# Patient Record
Sex: Female | Born: 1960 | Race: Black or African American | Hispanic: No | Smoking: Never smoker
Health system: Southern US, Community
[De-identification: ages and names within clinical notes are randomized; demographics above are authoritative.]

## PROBLEM LIST (undated history)

## (undated) DIAGNOSIS — B009 Herpesviral infection, unspecified: Secondary | ICD-10-CM

## (undated) DIAGNOSIS — K219 Gastro-esophageal reflux disease without esophagitis: Secondary | ICD-10-CM

## (undated) HISTORY — DX: Herpesviral infection, unspecified: B00.9

## (undated) HISTORY — DX: Gastro-esophageal reflux disease without esophagitis: K21.9

---

## 1998-03-21 ENCOUNTER — Other Ambulatory Visit: Admission: RE | Admit: 1998-03-21 | Discharge: 1998-03-21 | Payer: Self-pay | Admitting: Obstetrics and Gynecology

## 1999-03-12 ENCOUNTER — Other Ambulatory Visit: Admission: RE | Admit: 1999-03-12 | Discharge: 1999-03-12 | Payer: Self-pay | Admitting: Gynecology

## 1999-07-16 ENCOUNTER — Inpatient Hospital Stay (HOSPITAL_COMMUNITY): Admission: AD | Admit: 1999-07-16 | Discharge: 1999-07-16 | Payer: Self-pay | Admitting: Obstetrics and Gynecology

## 1999-10-07 ENCOUNTER — Observation Stay (HOSPITAL_COMMUNITY): Admission: AD | Admit: 1999-10-07 | Discharge: 1999-10-08 | Payer: Self-pay | Admitting: Gynecology

## 1999-10-13 ENCOUNTER — Inpatient Hospital Stay (HOSPITAL_COMMUNITY): Admission: AD | Admit: 1999-10-13 | Discharge: 1999-10-16 | Payer: Self-pay | Admitting: *Deleted

## 1999-11-20 ENCOUNTER — Other Ambulatory Visit: Admission: RE | Admit: 1999-11-20 | Discharge: 1999-11-20 | Payer: Self-pay | Admitting: Gynecology

## 2000-07-25 ENCOUNTER — Emergency Department (HOSPITAL_COMMUNITY): Admission: EM | Admit: 2000-07-25 | Discharge: 2000-07-25 | Payer: Self-pay | Admitting: Emergency Medicine

## 2000-12-31 ENCOUNTER — Other Ambulatory Visit: Admission: RE | Admit: 2000-12-31 | Discharge: 2000-12-31 | Payer: Self-pay | Admitting: Obstetrics

## 2001-01-08 ENCOUNTER — Encounter: Payer: Self-pay | Admitting: Obstetrics

## 2001-01-08 ENCOUNTER — Ambulatory Visit (HOSPITAL_COMMUNITY): Admission: RE | Admit: 2001-01-08 | Discharge: 2001-01-08 | Payer: Self-pay | Admitting: Obstetrics

## 2002-02-09 ENCOUNTER — Other Ambulatory Visit: Admission: RE | Admit: 2002-02-09 | Discharge: 2002-02-09 | Payer: Self-pay | Admitting: *Deleted

## 2002-02-10 ENCOUNTER — Encounter: Payer: Self-pay | Admitting: Gynecology

## 2002-02-10 ENCOUNTER — Ambulatory Visit (HOSPITAL_COMMUNITY): Admission: RE | Admit: 2002-02-10 | Discharge: 2002-02-10 | Payer: Self-pay | Admitting: Gynecology

## 2003-01-09 ENCOUNTER — Emergency Department (HOSPITAL_COMMUNITY): Admission: EM | Admit: 2003-01-09 | Discharge: 2003-01-09 | Payer: Self-pay | Admitting: Emergency Medicine

## 2003-02-16 ENCOUNTER — Ambulatory Visit (HOSPITAL_COMMUNITY): Admission: RE | Admit: 2003-02-16 | Discharge: 2003-02-16 | Payer: Self-pay | Admitting: Gynecology

## 2003-02-16 ENCOUNTER — Encounter: Payer: Self-pay | Admitting: Gynecology

## 2003-02-24 ENCOUNTER — Other Ambulatory Visit: Admission: RE | Admit: 2003-02-24 | Discharge: 2003-02-24 | Payer: Self-pay | Admitting: Gynecology

## 2004-03-05 ENCOUNTER — Ambulatory Visit (HOSPITAL_COMMUNITY): Admission: RE | Admit: 2004-03-05 | Discharge: 2004-03-05 | Payer: Self-pay | Admitting: Gynecology

## 2004-03-13 ENCOUNTER — Other Ambulatory Visit: Admission: RE | Admit: 2004-03-13 | Discharge: 2004-03-13 | Payer: Self-pay | Admitting: Gynecology

## 2005-03-11 ENCOUNTER — Ambulatory Visit (HOSPITAL_COMMUNITY): Admission: RE | Admit: 2005-03-11 | Discharge: 2005-03-11 | Payer: Self-pay | Admitting: Gynecology

## 2005-03-19 ENCOUNTER — Other Ambulatory Visit: Admission: RE | Admit: 2005-03-19 | Discharge: 2005-03-19 | Payer: Self-pay | Admitting: Gynecology

## 2005-07-25 ENCOUNTER — Other Ambulatory Visit: Admission: RE | Admit: 2005-07-25 | Discharge: 2005-07-25 | Payer: Self-pay | Admitting: Gynecology

## 2006-03-12 ENCOUNTER — Ambulatory Visit (HOSPITAL_COMMUNITY): Admission: RE | Admit: 2006-03-12 | Discharge: 2006-03-12 | Payer: Self-pay | Admitting: Gynecology

## 2006-03-27 ENCOUNTER — Other Ambulatory Visit: Admission: RE | Admit: 2006-03-27 | Discharge: 2006-03-27 | Payer: Self-pay | Admitting: Gynecology

## 2006-08-13 ENCOUNTER — Emergency Department (HOSPITAL_COMMUNITY): Admission: EM | Admit: 2006-08-13 | Discharge: 2006-08-13 | Payer: Self-pay | Admitting: Emergency Medicine

## 2007-03-15 ENCOUNTER — Ambulatory Visit (HOSPITAL_COMMUNITY): Admission: RE | Admit: 2007-03-15 | Discharge: 2007-03-15 | Payer: Self-pay | Admitting: Gynecology

## 2007-04-15 ENCOUNTER — Other Ambulatory Visit: Admission: RE | Admit: 2007-04-15 | Discharge: 2007-04-15 | Payer: Self-pay | Admitting: Gynecology

## 2008-03-17 ENCOUNTER — Ambulatory Visit (HOSPITAL_COMMUNITY): Admission: RE | Admit: 2008-03-17 | Discharge: 2008-03-17 | Payer: Self-pay | Admitting: Gynecology

## 2008-04-17 ENCOUNTER — Other Ambulatory Visit: Admission: RE | Admit: 2008-04-17 | Discharge: 2008-04-17 | Payer: Self-pay | Admitting: Gynecology

## 2009-05-22 ENCOUNTER — Encounter: Admission: RE | Admit: 2009-05-22 | Discharge: 2009-05-22 | Payer: Self-pay | Admitting: Gynecology

## 2009-06-07 ENCOUNTER — Encounter: Payer: Self-pay | Admitting: Women's Health

## 2009-06-07 ENCOUNTER — Other Ambulatory Visit: Admission: RE | Admit: 2009-06-07 | Discharge: 2009-06-07 | Payer: Self-pay | Admitting: Gynecology

## 2009-06-07 ENCOUNTER — Ambulatory Visit: Payer: Self-pay | Admitting: Women's Health

## 2009-12-25 HISTORY — PX: KNEE SURGERY: SHX244

## 2010-04-11 ENCOUNTER — Ambulatory Visit: Payer: Self-pay | Admitting: Women's Health

## 2010-05-23 ENCOUNTER — Encounter: Admission: RE | Admit: 2010-05-23 | Discharge: 2010-05-23 | Payer: Self-pay | Admitting: Gynecology

## 2010-06-10 ENCOUNTER — Other Ambulatory Visit: Admission: RE | Admit: 2010-06-10 | Discharge: 2010-06-10 | Payer: Self-pay | Admitting: Gynecology

## 2010-06-10 ENCOUNTER — Ambulatory Visit: Payer: Self-pay | Admitting: Women's Health

## 2010-11-17 ENCOUNTER — Encounter: Payer: Self-pay | Admitting: Gynecology

## 2011-05-14 ENCOUNTER — Other Ambulatory Visit: Payer: Self-pay | Admitting: Women's Health

## 2011-05-14 DIAGNOSIS — Z1231 Encounter for screening mammogram for malignant neoplasm of breast: Secondary | ICD-10-CM

## 2011-05-27 ENCOUNTER — Ambulatory Visit (HOSPITAL_COMMUNITY)
Admission: RE | Admit: 2011-05-27 | Discharge: 2011-05-27 | Disposition: A | Discharge status: Home / Self Care | Payer: BC Managed Care – PPO | Source: Ambulatory Visit | Attending: Women's Health | Admitting: Women's Health

## 2011-05-27 DIAGNOSIS — Z1231 Encounter for screening mammogram for malignant neoplasm of breast: Secondary | ICD-10-CM | POA: Insufficient documentation

## 2011-06-12 ENCOUNTER — Ambulatory Visit (INDEPENDENT_AMBULATORY_CARE_PROVIDER_SITE_OTHER): Payer: BC Managed Care – PPO | Admitting: Women's Health

## 2011-06-12 ENCOUNTER — Encounter: Payer: Self-pay | Admitting: Women's Health

## 2011-06-12 ENCOUNTER — Other Ambulatory Visit (HOSPITAL_COMMUNITY)
Admission: RE | Admit: 2011-06-12 | Discharge: 2011-06-12 | Disposition: A | Discharge status: Home / Self Care | Payer: BC Managed Care – PPO | Source: Ambulatory Visit | Attending: Women's Health | Admitting: Women's Health

## 2011-06-12 VITALS — BP 120/70 | Ht 65.5 in | Wt 150.0 lb

## 2011-06-12 DIAGNOSIS — Z833 Family history of diabetes mellitus: Secondary | ICD-10-CM

## 2011-06-12 DIAGNOSIS — Z01419 Encounter for gynecological examination (general) (routine) without abnormal findings: Secondary | ICD-10-CM

## 2011-06-12 DIAGNOSIS — R82998 Other abnormal findings in urine: Secondary | ICD-10-CM

## 2011-06-12 DIAGNOSIS — Z13220 Encounter for screening for lipoid disorders: Secondary | ICD-10-CM

## 2011-06-12 DIAGNOSIS — R87810 Cervical high risk human papillomavirus (HPV) DNA test positive: Secondary | ICD-10-CM | POA: Insufficient documentation

## 2011-06-12 NOTE — Progress Notes (Signed)
Brenda Hurst 1961/08/02 [id number]    History:    The patient presents for annual exam.  History of HSV with rare outbreaks.   Past medical history, past surgical history, family history and social history were all reviewed and documented in the EPIC chart.   ROS:  A  ROS was performed and pertinent positives and negatives are included in the history.  Exam:  Filed Vitals:   06/12/11 0939  BP: 120/70    General appearance:  Normal Head/Neck:  Normal, without cervical or supraclavicular adenopathy. Thyroid:  Symmetrical, normal in size, without palpable masses or nodularity. Respiratory  Effort:  Normal  Auscultation:  Clear without wheezing or rhonchi Cardiovascular  Auscultation:  Regular rate, without rubs, murmurs or gallops  Edema/varicosities:  Not grossly evident Abdominal   Soft,nontender, without masses, guarding or rebound.  Liver/spleen:  No organomegaly noted  Hernia:  None appreciated  Occult test:   Skin  Inspection:  Grossly normal  Palpation:  Grossly normal Neurologic/psychiatric  Orientation:  Normal with appropriate conversation.  Mood/affect:  Normal  Genitourinary    Breasts: Examined lying and sitting.     Right: Without masses, retractions, discharge or axillary adenopathy.     Left: Without masses, retractions, discharge or axillary adenopathy.   Inguinal/mons:  Normal without inguinal adenopathy  External genitalia:  Normal  BUS/Urethra/Skene's glands:  Normal  Bladder:  Normal  Vagina:  Normal  Cervix:  Normal  Uterus:   normal in size, shape and contour.  Midline and mobile  Adnexa/parametria:     Rt: Without masses or tenderness.   Lt: Without masses or tenderness.  Anus and perineum: Normal  Digital rectal exam: Normal sphincter tone without palpated masses or tenderness  Assessment/Plan:  50 y.o. MBF G1P1 annual exam.   Had a monthly cycle until this year, now is having a cycle every 2-3 months menopause was reviewed. Has not  used any contraception for many years. History of infertility, did review  condoms until no cycle for greater than a year. Having an occasional hot flash, nothing severe. Reviewed if no cycle for greater than 3 months to return to the office to check a FSH. HRT reviewed. Will check a CBC,  lipid profile, glucose, UA and Pap. SBEs, had a normal mammogram July 2012, encouraged to continue exercise, vitamin D 2000 daily andcalcium rich diet encouraged. Colonoscopy reviewed,will schedule.Marland Kitchen    Harrington Challenger [facility], 12:04 PM 06/12/2011

## 2011-06-13 ENCOUNTER — Telehealth: Payer: Self-pay | Admitting: Women's Health

## 2011-06-13 ENCOUNTER — Telehealth: Payer: Self-pay

## 2011-06-13 DIAGNOSIS — N390 Urinary tract infection, site not specified: Secondary | ICD-10-CM

## 2011-06-13 MED ORDER — SULFAMETHOXAZOLE-TRIMETHOPRIM 800-160 MG PO TABS: 1.0000 | ORAL_TABLET | Freq: Two times a day (BID) | ORAL | Status: AC

## 2011-06-13 NOTE — Telephone Encounter (Signed)
Telephone call, left message on cell to call Sherri. Reviewed glucose normal, hemoglobin and hematocrit are normal cholesterol -  lipid profile is great. Urine did show 50,000 pure . If symptomatic of pain burning frequency or urgency please call in Septra DS one by mouth twice a day for 3 days. #6

## 2011-06-13 NOTE — Telephone Encounter (Signed)
Addended by: Venora Maples on: 06/13/2011 09:32 AM   Modules accepted: Orders

## 2011-06-13 NOTE — Telephone Encounter (Signed)
PT. NOTIFIED OF NANCY'S NOTE. PT STATES SHE IS HAVING URINARY FREQUENCY. I  ESCRIBED RX TO PT'S PHARMACY & TOLD HER IF SHE DOES NOT IMPROVE 100% TO FOLLOW UP WITH NANCY NEST WEEK.

## 2011-06-13 NOTE — Telephone Encounter (Signed)
OPENED IN ERROR

## 2011-06-18 ENCOUNTER — Telehealth: Payer: Self-pay | Admitting: Women's Health

## 2011-06-18 NOTE — Telephone Encounter (Signed)
Reviewed Pap results. She's had a history of normal Paps. Pap was ASCUS with positive high-risk HPV. Did review HPV, will schedule a C. and B. with Dr. Lily Peer, switch to scheduling.

## 2011-06-20 ENCOUNTER — Encounter: Payer: Self-pay | Admitting: Gynecology

## 2011-06-20 ENCOUNTER — Ambulatory Visit (INDEPENDENT_AMBULATORY_CARE_PROVIDER_SITE_OTHER): Payer: BC Managed Care – PPO | Admitting: Gynecology

## 2011-06-20 VITALS — BP 116/78

## 2011-06-20 DIAGNOSIS — N912 Amenorrhea, unspecified: Secondary | ICD-10-CM

## 2011-06-20 DIAGNOSIS — R87610 Atypical squamous cells of undetermined significance on cytologic smear of cervix (ASC-US): Secondary | ICD-10-CM

## 2011-06-20 NOTE — Patient Instructions (Signed)
Patient information: Management of atypical squamous cells (ASC-US and ASC-H) and low grade cervical squamous intraepithelial lesions (LSIL) (Beyond the Basics)    Section Editor Alvera Novel, MD Deputy Editor Morton Amy, MD Disclosures  All topics are updated as new evidence becomes available and our peer review process is complete.  Literature review current through: Jul 2012.  This topic last updated: Feb 05, 2011.  INTRODUCTION - Squamous cells make up the outer layer of the cervix and vagina (picture 1). Atypical squamous cells (ASC) is the name given to squamous cells on a Pap smear or cervical cytology that do not have a normal appearance but are not clearly precancerous. Low grade squamous intraepithelial lesions (LSIL, also called low grade cervical intraepithelial neoplasia) refers to cells that appear slightly abnormal. Women who have ASC or LSIL require further testing because some women with these findings have a precancerous lesion of the cervix. This topic review discusses the management of women with ASC and LSIL. The management of women with high grade squamous intraepithelial lesions (HSIL) and atypical glandular cells (AGC) are discussed in a separate topic review. (See "Patient information: Management of high grade cervical squamous intraepithelial lesions (HSIL) and glandular abnormalities (AGC) (Beyond the Basics)".) ATYPICAL SQUAMOUS CELLS (ASC) - ASC is subdivided into atypical squamous cells of undetermined significance (ASC-US) and atypical squamous cells, cannot rule out a high grade lesion (ASC-H). The risk of a high-grade precancerous lesion in women with ASC-US is 15 percent and for those with ASC-H, the risk is 38 percent [1]. Atypical squamous cells of undetermined significance (ASC-US) - In women older than 20 years, there are three options for evaluation of a single ASC-US result. Women who are pregnant or younger than age 43 years are evaluated differently (see  'Adolescents' below and 'Pregnant women' below). Perform HPV testing. This is the preferred follow up for ASC-US. HPV testing is often done at the same time as the Pap smear. This is convenient because the woman does not have to return for a second visit. HPV testing is described in detail in a separate topic review (see "Patient information: Cervical cancer screening (Beyond the Basics)").  Women who test positive for HPV types that are high risk for cervical cancer should have colposcopy because they are at greater risk of having an underlying precancerous lesion.  Women who test negative for HPV are not likely to have cervical precancer. These women should have a repeat Pap smear in one year. In most cases, the ASC-US resolves during this time.  Repeat the Pap smear in six months. If this test is normal, it is repeated once more after another six months until there have been two normal tests in a row; the woman can then return to routine screening. If the woman has a second ASC-US result or a more severe abnormality develops, colposcopy is recommended. (See 'Colposcopy' below.)  Have colposcopy. (See 'Colposcopy' below.) Atypical squamous cells, cannot rule out a high grade lesion (ASC-H) - ASC-H is more likely than ASC-US to be caused by a precancerous change. This finding requires further evaluation with colposcopy (see 'Colposcopy' below). LOW-GRADE SQUAMOUS LESION (LSIL) - LSIL is usually caused by mild cellular changes. Further testing with colposcopy and cervical biopsy is almost always recommended for women with LSIL because 12 to 16 percent of women with LSIL have a precancerous lesion [2,3]. However, adolescents and postmenopausal women are evaluated somewhat differently (see 'Adolescents' below and 'Postmenopausal women' below). Pregnant women are evaluated similarly to non-pregnant women  but are also discussed separately below. The management of women with LSIL depends upon what is seen with  colposcopy and biopsy (see 'Management after colposcopy' below); most clinicians will delay biopsy until after delivery in pregnant women (see 'Pregnant women' below). COLPOSCOPY - Colposcopy is an office procedure that allows a clinician to closely examine the cervix. It is commonly performed after an abnormal Pap smear. Colposcopy is performed similar to a pelvic examination, while the woman lies on an exam table. A speculum is used to view the cervix, and the viewing device (called a colposcope) remains outside the woman's body (picture 1). The colposcope magnifies the appearance of the cervix. This allows the clinician to better see the location and size of any abnormalities, and also to see any changes in the capillaries (small blood vessels) on the surface of the cervix.  During colposcopy, a small piece of the abnormal area can be removed (biopsied). Anesthesia (numbing medicine) is not needed because the biopsy causes only mild discomfort or cramping. Some women also need to have a biopsy of the inner cervix during colposcopy; this is called endocervical curettage (ECC). Endocervix refers to the inner cervix and curettage means scraping. Pregnant women should not have ECC because it may disturb the pregnancy. Management after colposcopy - Most women who have a colposcopy have a biopsy of any abnormal-appearing areas. The biopsy samples are sent to a pathologist who determines if there is any evidence of precancerous changes, termed cervical intraepithelial neoplasia (CIN). These changes are categorized as being mild (CIN 1) or moderate to severe (CIN 2 or 3). CIN 1 biopsy in women with Pap smear results that were ASC-US, ASC-H or LSIL cytology - Follow-up is recommended with either HPV testing at 12 months or a Pap smear at 6 and 12 months. The reason for this recommendation is that CIN I is a minor abnormality that usually goes away over time without treatment. Waiting and repeating testing allows  time for the abnormality to resolve and also enables the healthcare provider to identify the few situations in which the abnormality has become more severe. Repeat colposcopy is recommended if the results of the follow-up Pap smear are ASC or greater or if the HPV test is positive. Women with two consecutive negative repeat cytology results or a negative HPV test can resume routine screening.  CIN biopsy in women with Pap smear results that were high-grade SIL (HSIL) or atypical glandular cells-not otherwise specified - Follow-up can be one of three options: (1) Pap smear and colposcopy every six months for a year; (2) re-review of both Pap smear and biopsy results by a pathologist; or (3) a procedure to remove a larger piece of tissue from the cervix (cone biopsy or loop electrosurgical excision procedure [called LEEP, loop, or LLETZ]).  CIN 2 or 3 - CIN 2 or 3 is usually treated by removing or destroying the abnormal area (using a cone biopsy, LEEP, laser, or freezing procedure). The reason for this recommendation is that moderate to severe precancerous abnormalities (CIN 2 or 3) are unlikely to resolve over time without treatment and may progress to cancer if left untreated over a period of years. (See "Patient information: Treatment of precancerous cells of the cervix (Beyond the Basics)".) However, adolescents and pregnant women are often able to delay treatment (see 'Adolescents' below and 'Pregnant women' below). SPECIAL CIRCUMSTANCES Postmenopausal women - In postmenopausal women, LSIL may be evaluated differently because thinning and drying of the tissues (referred to as atrophy) can  cause the cells to appear abnormal. These changes often resolve with time and are often not related to changes caused by HPV. Options for postmenopausal women with LSIL include the following: Colposcopy  HPV testing  Repeat Pap smear at 6 and 12 months If the HPV testing or repeat Pap smear tests are negative, the woman  may return to routine testing. If the HPV test or repeat Pap smear are abnormal (ASC or greater), colposcopy is recommended. The management of postmenopausal women after colposcopy is discussed above (see 'Management after colposcopy' above). Adolescents - In adolescent women (age 41 years or younger), abnormal Pap smear is often approached differently because, in this age group, there is a good chance that the abnormal area will resolve over time, without treatment. There is a high rate of HPV infection in this group, but a very low rate of cervical cancer. ASC-US, LSIL, and/or CIN 1 - Adolescents with ASC-US, LSIL, and/or CIN 1 are often advised to have repeat Pap smear in 12 months. HPV testing is not recommended because it is likely to be positive and would not affect the recommendation to repeat the test in 12 months. If the 12 month cytology shows ASC-US, ASC-H, or LSIL, the test is usually repeated 12 months later (at 24 months).  If the 12 month cytology shows HSIL or worse, the adolescent is usually advised to have colposcopy (see 'Colposcopy' above). If the 24 month test is abnormal (ASC-US or greater), the adolescent is usually advised to have colposcopy. If the 24 month test is normal, Pap smear is recommended once yearly. High grade lesions (CIN 2 or 3) - Adolescents with HSIL should undergo colposcopy. If cervical biopsy does NOT show HSIL, they can be followed with colposcopy and Pap smear every six months for two years. If cervical biopsy confirms HSIL, they can either be followed with Pap smear and colposcopy or HPV testing until they have had normal testing for one year. If these follow-up results are normal, they can resume routine screening. If follow-up testing shows abnormalities or the cervix cannot be fully evaluated, they will need further testing or removal of a part of the cervix (cone biopsy or LEEP). (See "Patient information: Treatment of precancerous cells of the cervix (Beyond  the Basics)".) Pregnant women - The evaluation and management of pregnant women is different from non-pregnant women because of the risk that trauma to the cervix could lead to preterm labor or delivery. ASC-US - Pregnant women with ASC-US and a positive HPV test may elect to have colposcopy during pregnancy or wait until at least six weeks after delivering their baby. The reason for this recommendation is that cervix appears somewhat different during pregnancy, which can make it difficult to determine if an area appears abnormal due to pregnancy or due to precancerous changes. In addition, most mild abnormalities resolve over time without treatment.  ASC-H - Pregnant women with ASC-H should have a colposcopy. This is because ASC-H is more likely than ASC-US to be caused by a precancerous change.  LSIL - Colposcopy is recommended for pregnant women with LSIL, similar to non-pregnant women

## 2011-06-20 NOTE — Progress Notes (Signed)
Subjective:     Patient ID: Brenda Hurst, female   DOB: Apr 20, 1961, 50 y.o.   MRN: [id number]  HPI patient is a 50 year old gravida 1 para 1 who was seen in the office recently and her Pap smear demonstrated atypical squamous cells of undetermined significance with high-risk HPV detected. She is here today for colposcopic evaluation. She is perimenopausal and her last menstrual period was 2 months ago and she is now using any form of contraception and a urine pregnancy test was done in the office which was negative. Otherwise she was asymptomatic with no prior abnormal pap smears. She did remarry 10 months ago.   Review of Systems please refer to entry in EPIC chart  Colposcopy:    Objective:   Physical Exam  Genitourinary:         Assessment:     Patient underwent a detail colposcopic evaluation. External genitalia and vagina were inspected. Perineum and perirectal unremarkable. Acetic acid was applied into the vagina and the cervix for thorough evaluation. Acetowhite area was noted from the 12 to 1:00 position of the ectocervix. The transformation sound was visualized. Ectocervical biopsy was obtained from the 12:00 position as well as a vigorous ECC. Monsel solution was used for hemostasis.    Plan:     Patient will be notified within a week with the results of the colposcopic directed biopsy with directions as to management. Literature information was provided on abnormal Pap smears his diagnosis and management. All questions are answered and we'll follow accordingly.

## 2011-06-20 NOTE — Progress Notes (Signed)
Addended byCammie Mcgee T on: 06/20/2011 11:39 AM   Modules accepted: Orders

## 2011-06-26 ENCOUNTER — Ambulatory Visit (INDEPENDENT_AMBULATORY_CARE_PROVIDER_SITE_OTHER): Payer: BC Managed Care – PPO | Admitting: Gynecology

## 2011-06-26 ENCOUNTER — Encounter: Payer: Self-pay | Admitting: Gynecology

## 2011-06-26 VITALS — BP 126/70

## 2011-06-26 DIAGNOSIS — N879 Dysplasia of cervix uteri, unspecified: Secondary | ICD-10-CM | POA: Insufficient documentation

## 2011-06-26 NOTE — Patient Instructions (Signed)
Patient information: Management of atypical squamous cells (ASC-US and ASC-H) and low grade cervical squamous intraepithelial lesions (LSIL) (Beyond the Basics)  Authors Clydene Pugh, MD Thayer Ohm, MD Section Editor Alvera Novel, MD Deputy Editor Morton Amy, MD Disclosures  All topics are updated as new evidence becomes available and our peer review process is complete.  Literature review current through: Jul 2012.  This topic last updated: Feb 05, 2011.  INTRODUCTION - Squamous cells make up the outer layer of the cervix and vagina (picture 1). Atypical squamous cells (ASC) is the name given to squamous cells on a Pap smear or cervical cytology that do not have a normal appearance but are not clearly precancerous. Low grade squamous intraepithelial lesions (LSIL, also called low grade cervical intraepithelial neoplasia) refers to cells that appear slightly abnormal. Women who have ASC or LSIL require further testing because some women with these findings have a precancerous lesion of the cervix. This topic review discusses the management of women with ASC and LSIL. The management of women with high grade squamous intraepithelial lesions (HSIL) and atypical glandular cells (AGC) are discussed in a separate topic review. (See "Patient information: Management of high grade cervical squamous intraepithelial lesions (HSIL) and glandular abnormalities (AGC) (Beyond the Basics)".) ATYPICAL SQUAMOUS CELLS (ASC) - ASC is subdivided into atypical squamous cells of undetermined significance (ASC-US) and atypical squamous cells, cannot rule out a high grade lesion (ASC-H). The risk of a high-grade precancerous lesion in women with ASC-US is 15 percent and for those with ASC-H, the risk is 38 percent [1]. Atypical squamous cells of undetermined significance (ASC-US) - In women older than 20 years, there are three options for evaluation of a single ASC-US result. Women who are pregnant or  younger than age 51 years are evaluated differently (see 'Adolescents' below and 'Pregnant women' below). Perform HPV testing. This is the preferred follow up for ASC-US. HPV testing is often done at the same time as the Pap smear. This is convenient because the woman does not have to return for a second visit. HPV testing is described in detail in a separate topic review (see "Patient information: Cervical cancer screening (Beyond the Basics)").  Women who test positive for HPV types that are high risk for cervical cancer should have colposcopy because they are at greater risk of having an underlying precancerous lesion.  Women who test negative for HPV are not likely to have cervical precancer. These women should have a repeat Pap smear in one year. In most cases, the ASC-US resolves during this time.  Repeat the Pap smear in six months. If this test is normal, it is repeated once more after another six months until there have been two normal tests in a row; the woman can then return to routine screening. If the woman has a second ASC-US result or a more severe abnormality develops, colposcopy is recommended. (See 'Colposcopy' below.)  Have colposcopy. (See 'Colposcopy' below.) Atypical squamous cells, cannot rule out a high grade lesion (ASC-H) - ASC-H is more likely than ASC-US to be caused by a precancerous change. This finding requires further evaluation with colposcopy (see 'Colposcopy' below). LOW-GRADE SQUAMOUS LESION (LSIL) - LSIL is usually caused by mild cellular changes. Further testing with colposcopy and cervical biopsy is almost always recommended for women with LSIL because 12 to 16 percent of women with LSIL have a precancerous lesion [2,3]. However, adolescents and postmenopausal women are evaluated somewhat differently (see 'Adolescents' below and 'Postmenopausal women' below). Pregnant women  are evaluated similarly to non-pregnant women but are also discussed separately below. The  management of women with LSIL depends upon what is seen with colposcopy and biopsy (see 'Management after colposcopy' below); most clinicians will delay biopsy until after delivery in pregnant women (see 'Pregnant women' below). COLPOSCOPY - Colposcopy is an office procedure that allows a clinician to closely examine the cervix. It is commonly performed after an abnormal Pap smear. Colposcopy is performed similar to a pelvic examination, while the woman lies on an exam table. A speculum is used to view the cervix, and the viewing device (called a colposcope) remains outside the woman's body (picture 1). The colposcope magnifies the appearance of the cervix. This allows the clinician to better see the location and size of any abnormalities, and also to see any changes in the capillaries (small blood vessels) on the surface of the cervix.  During colposcopy, a small piece of the abnormal area can be removed (biopsied). Anesthesia (numbing medicine) is not needed because the biopsy causes only mild discomfort or cramping. Some women also need to have a biopsy of the inner cervix during colposcopy; this is called endocervical curettage (ECC). Endocervix refers to the inner cervix and curettage means scraping. Pregnant women should not have ECC because it may disturb the pregnancy. Management after colposcopy - Most women who have a colposcopy have a biopsy of any abnormal-appearing areas. The biopsy samples are sent to a pathologist who determines if there is any evidence of precancerous changes, termed cervical intraepithelial neoplasia (CIN). These changes are categorized as being mild (CIN 1) or moderate to severe (CIN 2 or 3). CIN 1 biopsy in women with Pap smear results that were ASC-US, ASC-H or LSIL cytology - Follow-up is recommended with either HPV testing at 12 months or a Pap smear at 6 and 12 months. The reason for this recommendation is that CIN I is a minor abnormality that usually goes away over  time without treatment. Waiting and repeating testing allows time for the abnormality to resolve and also enables the healthcare provider to identify the few situations in which the abnormality has become more severe. Repeat colposcopy is recommended if the results of the follow-up Pap smear are ASC or greater or if the HPV test is positive. Women with two consecutive negative repeat cytology results or a negative HPV test can resume routine screening.  CIN biopsy in women with Pap smear results that were high-grade SIL (HSIL) or atypical glandular cells-not otherwise specified - Follow-up can be one of three options: (1) Pap smear and colposcopy every six months for a year; (2) re-review of both Pap smear and biopsy results by a pathologist; or (3) a procedure to remove a larger piece of tissue from the cervix (cone biopsy or loop electrosurgical excision procedure [called LEEP, loop, or LLETZ]).  CIN 2 or 3 - CIN 2 or 3 is usually treated by removing or destroying the abnormal area (using a cone biopsy, LEEP, laser, or freezing procedure). The reason for this recommendation is that moderate to severe precancerous abnormalities (CIN 2 or 3) are unlikely to resolve over time without treatment and may progress to cancer if left untreated over a period of years. (See "Patient information: Treatment of precancerous cells of the cervix (Beyond the Basics)".) However, adolescents and pregnant women are often able to delay treatment (see 'Adolescents' below and 'Pregnant women' below). SPECIAL CIRCUMSTANCES Postmenopausal women - In postmenopausal women, LSIL may be evaluated differently because thinning and drying of the  tissues (referred to as atrophy) can cause the cells to appear abnormal. These changes often resolve with time and are often not related to changes caused by HPV. Options for postmenopausal women with LSIL include the following: Colposcopy  HPV testing  Repeat Pap smear at 6 and 12 months If the  HPV testing or repeat Pap smear tests are negative, the woman may return to routine testing. If the HPV test or repeat Pap smear are abnormal (ASC or greater), colposcopy is recommended. The management of postmenopausal women after colposcopy is discussed above (see 'Management after colposcopy' above). Adolescents - In adolescent women (age 56 years or younger), abnormal Pap smear is often approached differently because, in this age group, there is a good chance that the abnormal area will resolve over time, without treatment. There is a high rate of HPV infection in this group, but a very low rate of cervical cancer. ASC-US, LSIL, and/or CIN 1 - Adolescents with ASC-US, LSIL, and/or CIN 1 are often advised to have repeat Pap smear in 12 months. HPV testing is not recommended because it is likely to be positive and would not affect the recommendation to repeat the test in 12 months. If the 12 month cytology shows ASC-US, ASC-H, or LSIL, the test is usually repeated 12 months later (at 24 months).  If the 12 month cytology shows HSIL or worse, the adolescent is usually advised to have colposcopy (see 'Colposcopy' above). If the 24 month test is abnormal (ASC-US or greater), the adolescent is usually advised to have colposcopy. If the 24 month test is normal, Pap smear is recommended once yearly. High grade lesions (CIN 2 or 3) - Adolescents with HSIL should undergo colposcopy. If cervical biopsy does NOT show HSIL, they can be followed with colposcopy and Pap smear every six months for two years. If cervical biopsy confirms HSIL, they can either be followed with Pap smear and colposcopy or HPV testing until they have had normal testing for one year. If these follow-up results are normal, they can resume routine screening. If follow-up testing shows abnormalities or the cervix cannot be fully evaluated, they will need further testing or removal of a part of the cervix (cone biopsy or LEEP). (See "Patient  information: Treatment of precancerous cells of the cervix (Beyond the Basics)".) Pregnant women - The evaluation and management of pregnant women is different from non-pregnant women because of the risk that trauma to the cervix could lead to preterm labor or delivery. ASC-US - Pregnant women with ASC-US and a positive HPV test may elect to have colposcopy during pregnancy or wait until at least six weeks after delivering their baby. The reason for this recommendation is that cervix appears somewhat different during pregnancy, which can make it difficult to determine if an area appears abnormal due to pregnancy or due to precancerous changes. In addition, most mild abnormalities resolve over time without treatment.  ASC-H - Pregnant women with ASC-H should have a colposcopy. This is because ASC-H is more likely than ASC-US to be caused by a precancerous change.  LSIL - Colposcopy is recommended for pregnant women with LSIL, similar to non-pregnant women.

## 2011-06-26 NOTE — Progress Notes (Signed)
Patient 50 year old gravida 1 para 1 who presented to the office today to discuss the result of her colposcopic directed biopsy that was done August 24 because of her previously done Pap smear demonstrated atypical squamous cells of undetermined significance with high-risk HPV. The colposcopic directed biopsy had demonstrated low-grade squamous intraepithelial lesion with benign endocervical cells from the ECC specimen and the 12:00 ectocervical biopsy demonstrated low-grade squamous intraepithelial lesion. Patient denies any prior abnormal Pap smears. She recently remarried 10 months ago. We discussed the new guidelines. Since entire lesion was visualized at, colposcopic evaluation. She will return back to the office in 6 months for followup Pap smear with a vigorous ECC. She prefers this instead of waiting 12 months for HPV testing. Literature information was provided. Patient understands all questions were answered and she understands that this lesion has a high propensity for spontaneous regression with her past history of normal Pap smears.

## 2011-12-29 ENCOUNTER — Other Ambulatory Visit (HOSPITAL_COMMUNITY)
Admission: RE | Admit: 2011-12-29 | Discharge: 2011-12-29 | Disposition: A | Discharge status: Home / Self Care | Payer: BC Managed Care – PPO | Source: Ambulatory Visit | Attending: Obstetrics and Gynecology | Admitting: Obstetrics and Gynecology

## 2011-12-29 ENCOUNTER — Ambulatory Visit (INDEPENDENT_AMBULATORY_CARE_PROVIDER_SITE_OTHER): Payer: BC Managed Care – PPO | Admitting: Women's Health

## 2011-12-29 ENCOUNTER — Encounter: Payer: Self-pay | Admitting: Women's Health

## 2011-12-29 DIAGNOSIS — R87611 Atypical squamous cells cannot exclude high grade squamous intraepithelial lesion on cytologic smear of cervix (ASC-H): Secondary | ICD-10-CM

## 2011-12-29 DIAGNOSIS — Z01419 Encounter for gynecological examination (general) (routine) without abnormal findings: Secondary | ICD-10-CM | POA: Insufficient documentation

## 2011-12-29 DIAGNOSIS — Z87410 Personal history of cervical dysplasia: Secondary | ICD-10-CM

## 2011-12-29 NOTE — Progress Notes (Signed)
Patient ID: Brenda Hurst, female   DOB: [birth year], 51 y.o.   MRN: [id number] Presents for a repeat Pap. Pap August 2012 ASCUS with positive high risk HPV. 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 History of ASCUS in 2006x1 other Paps normal. Same partner for greater than 2 years with negative STD screening. Without complaint today. Continues to have monthly 3 day cycle.  Exam: External genitalia within normal limits, speculum exam cervix is pink without lesion or discharge.   Plan: Pap taken. We'll triage based on results reviewed importance of 6 months followup until 4 normal Paps.

## 2012-04-28 ENCOUNTER — Other Ambulatory Visit: Payer: Self-pay | Admitting: Gynecology

## 2012-04-28 DIAGNOSIS — Z1231 Encounter for screening mammogram for malignant neoplasm of breast: Secondary | ICD-10-CM

## 2012-05-27 ENCOUNTER — Ambulatory Visit (HOSPITAL_COMMUNITY)
Admission: RE | Admit: 2012-05-27 | Discharge: 2012-05-27 | Disposition: A | Discharge status: Home / Self Care | Payer: BC Managed Care – PPO | Source: Ambulatory Visit | Attending: Gynecology | Admitting: Gynecology

## 2012-05-27 DIAGNOSIS — Z1231 Encounter for screening mammogram for malignant neoplasm of breast: Secondary | ICD-10-CM | POA: Insufficient documentation

## 2012-06-14 ENCOUNTER — Other Ambulatory Visit (HOSPITAL_COMMUNITY)
Admission: RE | Admit: 2012-06-14 | Discharge: 2012-06-14 | Disposition: A | Discharge status: Home / Self Care | Payer: BC Managed Care – PPO | Source: Ambulatory Visit | Attending: Women's Health | Admitting: Women's Health

## 2012-06-14 ENCOUNTER — Encounter: Payer: BC Managed Care – PPO | Admitting: Women's Health

## 2012-06-14 ENCOUNTER — Ambulatory Visit (INDEPENDENT_AMBULATORY_CARE_PROVIDER_SITE_OTHER): Payer: BC Managed Care – PPO | Admitting: Women's Health

## 2012-06-14 ENCOUNTER — Encounter: Payer: Self-pay | Admitting: Women's Health

## 2012-06-14 VITALS — BP 110/70 | Ht 63.5 in | Wt 154.0 lb

## 2012-06-14 DIAGNOSIS — N879 Dysplasia of cervix uteri, unspecified: Secondary | ICD-10-CM

## 2012-06-14 DIAGNOSIS — Z01419 Encounter for gynecological examination (general) (routine) without abnormal findings: Secondary | ICD-10-CM

## 2012-06-14 DIAGNOSIS — Z1151 Encounter for screening for human papillomavirus (HPV): Secondary | ICD-10-CM | POA: Insufficient documentation

## 2012-06-14 DIAGNOSIS — B009 Herpesviral infection, unspecified: Secondary | ICD-10-CM

## 2012-06-14 DIAGNOSIS — Z833 Family history of diabetes mellitus: Secondary | ICD-10-CM

## 2012-06-14 DIAGNOSIS — Z13220 Encounter for screening for lipoid disorders: Secondary | ICD-10-CM

## 2012-06-14 DIAGNOSIS — N926 Irregular menstruation, unspecified: Secondary | ICD-10-CM

## 2012-06-14 LAB — CBC WITH DIFFERENTIAL/PLATELET
Basophils Absolute: 0 10*3/uL (ref 0.0–0.1)
Eosinophils Absolute: 0.1 10*3/uL (ref 0.0–0.7)
Eosinophils Relative: 3 % (ref 0–5)
Lymphocytes Relative: 29 % (ref 12–46)
MCV: 85.4 fL (ref 78.0–100.0)
Neutrophils Relative %: 59 % (ref 43–77)
Platelets: 282 10*3/uL (ref 150–400)
RBC: 4.74 MIL/uL (ref 3.87–5.11)
RDW: 13.8 % (ref 11.5–15.5)
WBC: 4.8 10*3/uL (ref 4.0–10.5)

## 2012-06-14 MED ORDER — VALACYCLOVIR HCL 500 MG PO TABS: ORAL_TABLET | ORAL | Status: DC

## 2012-06-14 NOTE — Patient Instructions (Addendum)

## 2012-06-14 NOTE — Progress Notes (Signed)
Brenda Hurst 1961/10/03 [id number]    History:    The patient presents for annual exam. Menstrual history vague. Last cycle September 2012, slight spotting 2/13. History of normal Paps until 05/2011, ascus with positive HR HPV with C&B with LGSIL/HPV, Pap 3/13 normal. History of HSV 1 with rare outbreaks. Normal mammogram history. Has not had a colonoscopy.   Past medical history, past surgical history, family history and social history were all reviewed and documented in the EPIC chart. Brenda Hurst 11,  doing well. Works at a school. Remarried 2011, did not change name.   ROS:  A  ROS was performed and pertinent positives and negatives are included in the history.  Exam:  Filed Vitals:   06/14/12 1504  BP: 110/70    General appearance:  Normal Head/Neck:  Normal, without cervical or supraclavicular adenopathy. Thyroid:  Symmetrical, normal in size, without palpable masses or nodularity. Respiratory  Effort:  Normal  Auscultation:  Clear without wheezing or rhonchi Cardiovascular  Auscultation:  Regular rate, without rubs, murmurs or gallops  Edema/varicosities:  Not grossly evident Abdominal  Soft,nontender, without masses, guarding or rebound.  Liver/spleen:  No organomegaly noted  Hernia:  None appreciated  Skin  Inspection:  Grossly normal  Palpation:  Grossly normal Neurologic/psychiatric  Orientation:  Normal with appropriate conversation.  Mood/affect:  Normal  Genitourinary    Breasts: Examined lying and sitting.     Right: Without masses, retractions, discharge or axillary adenopathy.     Left: Without masses, retractions, discharge or axillary adenopathy.   Inguinal/mons:  Normal without inguinal adenopathy  External genitalia:  Normal  BUS/Urethra/Skene's glands:  Normal  Bladder:  Normal  Vagina:  Normal  Cervix:  Normal  Uterus:   normal in size, shape and contour.  Midline and mobile  Adnexa/parametria:     Rt: Without masses or tenderness.   Lt: Without  masses or tenderness.  Anus and perineum: Normal  Digital rectal exam: Normal sphincter tone without palpated masses or tenderness  Assessment/Plan:  51 y.o. MBF G1 P1 for annual exam with no complaints.     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 New screening guidelines reviewed. SBE's, continue annual mammogram. Reviewed importance of screening colonoscopy instructed to schedule. Reviewed menopause, instructed to call if further bleeding.     Brenda Hurst [facility] : [facility], 5:08 PM 06/14/2012

## 2012-06-15 LAB — LIPID PANEL
LDL Cholesterol: 97 mg/dL (ref 0–99)
Triglycerides: 136 mg/dL (ref <150)

## 2012-06-15 LAB — GLUCOSE, RANDOM: Glucose, Bld: 85 mg/dL (ref 70–99)

## 2012-06-16 ENCOUNTER — Encounter: Payer: Self-pay | Admitting: Gynecology

## 2012-06-16 ENCOUNTER — Other Ambulatory Visit: Payer: Self-pay | Admitting: Women's Health

## 2012-06-16 LAB — URINALYSIS W MICROSCOPIC + REFLEX CULTURE
Casts: NONE SEEN
Crystals: NONE SEEN
Glucose, UA: NEGATIVE mg/dL
Nitrite: NEGATIVE
Specific Gravity, Urine: 1.022 (ref 1.005–1.030)
pH: 6.5 (ref 5.0–8.0)

## 2012-06-16 MED ORDER — MEDROXYPROGESTERONE ACETATE 10 MG PO TABS: 10.0000 mg | ORAL_TABLET | Freq: Every day | ORAL | Status: DC

## 2012-06-17 LAB — URINE CULTURE

## 2012-06-21 ENCOUNTER — Other Ambulatory Visit: Payer: Self-pay | Admitting: Women's Health

## 2012-06-21 MED ORDER — FLUCONAZOLE 150 MG PO TABS: 150.0000 mg | ORAL_TABLET | Freq: Once | ORAL | Status: AC

## 2012-11-10 ENCOUNTER — Telehealth: Payer: Self-pay | Admitting: *Deleted

## 2012-11-10 ENCOUNTER — Ambulatory Visit (INDEPENDENT_AMBULATORY_CARE_PROVIDER_SITE_OTHER): Payer: BC Managed Care – PPO | Admitting: Women's Health

## 2012-11-10 ENCOUNTER — Encounter: Payer: Self-pay | Admitting: Women's Health

## 2012-11-10 DIAGNOSIS — N644 Mastodynia: Secondary | ICD-10-CM

## 2012-11-10 DIAGNOSIS — F419 Anxiety disorder, unspecified: Secondary | ICD-10-CM

## 2012-11-10 DIAGNOSIS — F411 Generalized anxiety disorder: Secondary | ICD-10-CM

## 2012-11-10 MED ORDER — ALPRAZOLAM 0.25 MG PO TABS: 0.2500 mg | ORAL_TABLET | Freq: Every evening | ORAL | Status: DC | PRN

## 2012-11-10 NOTE — Telephone Encounter (Signed)
Order placed for diag. Mammogram.

## 2012-11-10 NOTE — Progress Notes (Signed)
Patient ID: Brenda Hurst, female   DOB: Aug 19, 1961, 52 y.o.   MRN: [id number] Presents with complaint of left breast discomfort especially in outer aspect  with no palpable change in SBE's for about 2 weeks. States is also been having increased anxiety over 31 year old son not doing is best at school or at home. States has some indigestion without relief with Tums or Rolaids. Drinks 2-3 caffeinated beverages daily. Symptoms all occur simultaneously. Denies shortness of breath, nausea, or dizziness. Normal mammogram 05/2012 at breast center.  Exam: Appears well. Heart regular rate and rhythm 84, blood pressure 110/72. Breast exam and sitting in lying position without palpable nodules, retractions, nipple discharge.  Left breast tenderness Anxiety - home and work stress  Plan: Diagnostic left breast mammogram. Reviewed counseling, declines need at this time. Xanax 0.25 to use sparingly, prescription, proper use given and reviewed. Decrease caffeinated beverages and take vitamin E  twice daily.

## 2012-11-10 NOTE — Telephone Encounter (Signed)
Message copied by Aura Camps on Wed Nov 10, 2012  4:25 PM ------      Message from: [place], [place] J      Created: Wed Nov 10, 2012  3:03 PM       Diagnostic left  mammogram, both breasts tender, left greater esp outer quad.              Best day January 20th am best /or any other time      If not then after 1:30

## 2012-11-15 NOTE — Telephone Encounter (Signed)
Appointment 11/22/12 @ 7:50 am

## 2012-11-22 ENCOUNTER — Ambulatory Visit
Admission: RE | Admit: 2012-11-22 | Discharge: 2012-11-22 | Disposition: A | Discharge status: Home / Self Care | Payer: BC Managed Care – PPO | Source: Ambulatory Visit | Attending: Women's Health | Admitting: Women's Health

## 2012-11-22 DIAGNOSIS — N644 Mastodynia: Secondary | ICD-10-CM

## 2013-04-20 ENCOUNTER — Ambulatory Visit: Payer: BC Managed Care – PPO | Admitting: Internal Medicine

## 2013-06-29 ENCOUNTER — Encounter: Payer: Self-pay | Admitting: Women's Health

## 2013-06-29 ENCOUNTER — Other Ambulatory Visit (HOSPITAL_COMMUNITY)
Admission: RE | Admit: 2013-06-29 | Discharge: 2013-06-29 | Disposition: A | Discharge status: Home / Self Care | Payer: BC Managed Care – PPO | Source: Ambulatory Visit | Attending: Gynecology | Admitting: Gynecology

## 2013-06-29 ENCOUNTER — Ambulatory Visit (INDEPENDENT_AMBULATORY_CARE_PROVIDER_SITE_OTHER): Payer: BC Managed Care – PPO | Admitting: Women's Health

## 2013-06-29 VITALS — BP 112/78 | Ht 65.0 in | Wt 159.0 lb

## 2013-06-29 DIAGNOSIS — Z01419 Encounter for gynecological examination (general) (routine) without abnormal findings: Secondary | ICD-10-CM

## 2013-06-29 DIAGNOSIS — E079 Disorder of thyroid, unspecified: Secondary | ICD-10-CM

## 2013-06-29 DIAGNOSIS — B009 Herpesviral infection, unspecified: Secondary | ICD-10-CM

## 2013-06-29 DIAGNOSIS — Z833 Family history of diabetes mellitus: Secondary | ICD-10-CM

## 2013-06-29 LAB — CBC WITH DIFFERENTIAL/PLATELET
Eosinophils Absolute: 0.1 10*3/uL (ref 0.0–0.7)
Lymphocytes Relative: 32 % (ref 12–46)
Lymphs Abs: 1 10*3/uL (ref 0.7–4.0)
MCH: 28.1 pg (ref 26.0–34.0)
Neutro Abs: 1.7 10*3/uL (ref 1.7–7.7)
Neutrophils Relative %: 54 % (ref 43–77)
Platelets: 245 10*3/uL (ref 150–400)
RBC: 4.69 MIL/uL (ref 3.87–5.11)
WBC: 3.1 10*3/uL — ABNORMAL LOW (ref 4.0–10.5)

## 2013-06-29 LAB — TSH: TSH: 0.88 u[IU]/mL (ref 0.350–4.500)

## 2013-06-29 LAB — GLUCOSE, RANDOM: Glucose, Bld: 89 mg/dL (ref 70–99)

## 2013-06-29 MED ORDER — VALACYCLOVIR HCL 500 MG PO TABS: ORAL_TABLET | ORAL | Status: DC

## 2013-06-29 NOTE — Progress Notes (Signed)
Brenda Hurst Jul 31, 1961 [id number]    History:    The patient presents for annual exam.  Amenorrheic x1 year. Was having numerous hot flashes/poor sleep and is now doing much better. Normal Paps until 2012, ascus with positive HR HPV, CIN 1 on C&B.  Normal Pap 2013. HSV with rare outbreaks. Normal mammograms. Has not had a colonoscopy. Normal lipid panel and glucose 2013.   Past medical history, past surgical history, family history and social history were all reviewed and documented in the EPIC chart. Remarried in 2011 doing well. Brenda Hurst 12 doing well. Works as a Programmer, multimedia. Father died of liver failure, mother died of asthma.   ROS:  A  ROS was performed and pertinent positives and negatives are included in the history.  Exam:  Filed Vitals:   06/29/13 0841  BP: 112/78    General appearance:  Normal Head/Neck:  Normal, without cervical or supraclavicular adenopathy. Thyroid:  Symmetrical, normal in size, without palpable masses or nodularity. Respiratory  Effort:  Normal  Auscultation:  Clear without wheezing or rhonchi Cardiovascular  Auscultation:  Regular rate, without rubs, murmurs or gallops  Edema/varicosities:  Not grossly evident Abdominal  Soft,nontender, without masses, guarding or rebound.  Liver/spleen:  No organomegaly noted  Hernia:  None appreciated  Skin  Inspection:  Grossly normal  Palpation:  Grossly normal Neurologic/psychiatric  Orientation:  Normal with appropriate conversation.  Mood/affect:  Normal  Genitourinary    Breasts: Examined lying and sitting.     Right: Without masses, retractions, discharge or axillary adenopathy.     Left: Without masses, retractions, discharge or axillary adenopathy.   Inguinal/mons:  Normal without inguinal adenopathy  External genitalia:  Normal  BUS/Urethra/Skene's glands:  Normal  Bladder:  Normal  Vagina:  Normal  Cervix:  Normal  Uterus:   normal in size, shape and contour.  Midline and  mobile  Adnexa/parametria:     Rt: Without masses or tenderness.   Lt: Without masses or tenderness.  Anus and perineum: Normal  Digital rectal exam: Normal sphincter tone without palpated masses or tenderness  Assessment/Plan:  52 y.o.MBF G1P1  for annual exam with complaint of weight gain.  10 pound weight gain past year Overdue for colonoscopy Ascus positive HR HPV 2012 normal after Postmenopausal on no HRT HSV-rare outbreaks  Plan: Reviewed importance a screening colonoscopy, instructed to schedule. SBE's, continue annual mammogram, calcium rich diet, vitamin D 2000 daily encouraged. DEXA, will schedule. CBC, glucose, TSH, UA, Pap. Healthy diet, increase regular exercise, decrease simple sugars and carbs encouraged. Denies need for HRT, reviewed importance of calling if any bleeding. Valtrex 500 twice daily for 3-5 days as needed.    Harrington Challenger [facility], 9:16 AM 06/29/2013

## 2013-06-29 NOTE — Patient Instructions (Addendum)
Schedule colonoscopy Vit D 2000 daily  1500 Calorie Diabetic Diet The 1500 calorie diabetic diet limits calories to 1500 each day. Following this diet and making healthy meal choices can help improve overall health. It controls blood glucose (sugar) levels and can also help lower blood pressure and cholesterol.  SERVING SIZES Measuring foods and serving sizes helps to make sure you are getting the right amount of food. The list below tells how big or small some common serving sizes are.  1 oz.........4 stacked dice.  3 oz........Marland KitchenDeck of cards.  1 tsp.......Marland KitchenTip of little finger.  1 tbs......Marland KitchenMarland KitchenThumb.  2 tbs.......Marland KitchenGolf ball.   cup......Marland KitchenHalf of a fist.  1 cup.......Marland KitchenA fist. GUIDELINES FOR CHOOSING FOODS The goal of this diet is to eat a variety of foods and limit calories to 1500 each day. This can be done by choosing foods that are low in calories and fat. The diet also suggests eating small amounts of food frequently. Doing this helps control your blood glucose levels, so they do not get too high or too low. Each meal or snack may include a protein food source to help you feel more satisfied. Try to eat about the same amount of food around the same time each day. This includes weekend days, travel days, and days off work. Space your meals about 4 to 5 hours apart, and add a snack between them, if you wish.  For example, a daily food plan could include breakfast, a morning snack, lunch, dinner, and an evening snack. Healthy meals and snacks have different types of foods, including whole grains, vegetables, fruits, lean meats, poultry, fish, and dairy products. As you plan your meals, select a variety of foods. Choose from the bread and starch, vegetable, fruit, dairy, and meat/protein groups. Examples of foods from each group are listed below, with their suggested serving sizes. Use measuring cups and spoons to become familiar with what a healthy portion looks like. Bread and Starch Each  serving equals 15 grams of carbohydrate.  1 slice bread.   bagel.   cup cold cereal (unsweetened).   cup hot cereal or mashed potatoes.  1 small potato (size of a computer mouse).   cup cooked pasta or rice.   English muffin.  1 cup broth-based soup.  3 cups of popcorn.  4 to 6 whole-wheat crackers.   cup cooked beans, peas, or corn. Vegetables Each serving equals 5 grams of carbohydrate.   cup cooked vegetables.  1 cup raw vegetables.   cup tomato or vegetable juice. Fruit Each serving equals 15 grams of carbohydrate.  1 small apple or orange.  1  cup watermelon or strawberries.   cup applesauce (no sugar added).  2 tbs raisins.   banana.   cup canned fruit, packed in water or in its own juice.   cup unsweetened fruit juice. Dairy Each serving equals 12 to 15 grams of carbohydrate.  1 cup fat-free milk.  6 oz artificially sweetened yogurt or plain yogurt.  1 cup low-fat buttermilk.  1 cup soy milk.  1 cup almond milk. Meat/Protein  1 large egg.  2 to 3 oz meat, poultry, or fish.   cup low-fat cottage cheese.  1 tbs peanut butter.  1 oz low-fat cheese.   cup tuna, packed in water.   cup tofu. Fat  1 tsp oil.  1 tsp trans-fat-free margarine.  1 tsp butter.  1 tsp mayonnaise.  2 tbs avocado.  1 tbs salad dressing.  1 tbs cream cheese.  2  tbs sour cream. SAMPLE 1500 CALORIE DIET PLAN Breakfast   whole-wheat English muffin (1 carb serving).  1 tsp trans-fat-free margarine.  1 scrambled egg.  1 cup fat-free milk (1 carb serving).  1 small orange (1 carb serving). Lunch  Chicken wrap.  1 whole-wheat tortilla, 8-inch (1 carb servings).  2 oz chicken breast, sliced.  2 tbs low-fat salad dressing, such as [place].   cup shredded lettuce.  2 slices tomato.   cup carrot sticks.  1 small apple (1 carb serving). Afternoon Snack  3 graham cracker squares (1 carb serving).  1 tbs peanut  butter. Dinner  2 oz lean pork chop, broiled.  1 cup brown rice (3 carb servings).   cup steamed carrots.   cup green beans.  1 cup fat-free milk (1 carb serving).  1 tsp trans-fat-free margarine. Evening Snack   cup low-fat cottage cheese.  1 small peach or pear, sliced (or  cup canned in water) (1 carb serving). MEAL PLAN You can use this worksheet to help you make a daily meal plan based on the 1500 calorie diabetic diet suggestions. If you are using this plan to help you control your blood glucose, you may interchange carbohydrate containing foods (dairy, starches, and fruits). Select a variety of fresh foods of varying colors and flavors. The total amount of carbohydrate in your meals or snacks is more important than making sure you include all of the food groups every time you eat. You can choose from approximately this many of the following foods to build your day's meals:  6 Starches.  3 Vegetables.  2 Fruits.  2 Dairy.  4 to 6 oz Meat/Protein.  Up to 3 Fats. Your dietician can use this worksheet to help you decide how many servings and which types of foods are right for you. BREAKFAST Food Group and Servings / Food Choice Starch _________________________________________________________ Dairy __________________________________________________________ Fruit ___________________________________________________________ Meat/Protein____________________________________________________ Fat ____________________________________________________________ LUNCH Food Group and Servings / Food Choice  Starch _________________________________________________________ Meat/Protein ___________________________________________________ Vegetables _____________________________________________________ Fruit __________________________________________________________ Dairy __________________________________________________________ Fat  ____________________________________________________________ Brenda Hurst Food Group and Servings / Food Choice Dairy __________________________________________________________ Starch _________________________________________________________ Meat/Protein____________________________________________________ Brenda Hurst ___________________________________________________________ Brenda Hurst Food Group and Servings / Food Choice Starch _________________________________________________________ Meat/Protein ___________________________________________________ Dairy __________________________________________________________ Vegetable ______________________________________________________ Fruit ___________________________________________________________ Fat ____________________________________________________________ Brenda Hurst Food Group and Servings / Food Choice Fruit ___________________________________________________________ Meat/Protein ____________________________________________________ Dairy __________________________________________________________ Starch __________________________________________________________ DAILY TOTALS Starches _________________________ Vegetables _______________________ Fruits ____________________________ Dairy ____________________________ Meat/Protein_____________________ Fats _____________________________ Document Released: 05/05/2005 Document Revised: 01/05/2012 Document Reviewed: 08/30/2009 ExitCare Patient Information 2014 [employer].

## 2013-06-30 LAB — URINALYSIS W MICROSCOPIC + REFLEX CULTURE
Bacteria, UA: NONE SEEN
Crystals: NONE SEEN
Ketones, ur: NEGATIVE mg/dL
Nitrite: NEGATIVE
Specific Gravity, Urine: 1.019 (ref 1.005–1.030)
Urobilinogen, UA: 0.2 mg/dL (ref 0.0–1.0)

## 2013-07-01 ENCOUNTER — Other Ambulatory Visit: Payer: Self-pay | Admitting: Women's Health

## 2013-07-01 ENCOUNTER — Telehealth: Payer: Self-pay

## 2013-07-01 DIAGNOSIS — N390 Urinary tract infection, site not specified: Secondary | ICD-10-CM

## 2013-07-01 DIAGNOSIS — D72819 Decreased white blood cell count, unspecified: Secondary | ICD-10-CM

## 2013-07-01 MED ORDER — CIPROFLOXACIN HCL 250 MG PO TABS: 250.0000 mg | ORAL_TABLET | Freq: Two times a day (BID) | ORAL | Status: DC

## 2013-07-01 NOTE — Telephone Encounter (Signed)
Patient informed of below and Rx  Called in.  Patient questioned why does she keep getting UTI's.  I told her that you would probably refer her to urologist for that answer.  She said if you think that is appropriate she would like to be referred to urology.

## 2013-07-01 NOTE — Telephone Encounter (Signed)
Message left,  Olegario Messier, last UTI treated here was in 2012 . Ask if she has been treated some where else.

## 2013-07-01 NOTE — Telephone Encounter (Signed)
Message copied by Keenan Bachelor on Fri Jul 01, 2013  3:27 PM ------      Message from: [place], [place] J      Created: Fri Jul 01, 2013  9:06 AM       Please call and inform blood sugar, TSH, Pap normal. Urine showed positive for UTI. Cipro 250 twice daily for 3 days #6. Her white count was slightly low, repeat CBC when she comes back for a test of cure UA in 2 weeks. ------

## 2013-07-02 LAB — URINE CULTURE

## 2013-07-04 NOTE — Telephone Encounter (Signed)
Brenda Hurst spoke with patient and saw her in the office on 07/04/13.

## 2013-07-19 ENCOUNTER — Ambulatory Visit: Payer: BC Managed Care – PPO | Admitting: Internal Medicine

## 2013-07-21 ENCOUNTER — Other Ambulatory Visit: Payer: BC Managed Care – PPO

## 2013-07-21 DIAGNOSIS — N390 Urinary tract infection, site not specified: Secondary | ICD-10-CM

## 2013-07-21 DIAGNOSIS — D72819 Decreased white blood cell count, unspecified: Secondary | ICD-10-CM

## 2013-07-21 LAB — CBC WITH DIFFERENTIAL/PLATELET
Basophils Relative: 1 % (ref 0–1)
Eosinophils Absolute: 0.2 10*3/uL (ref 0.0–0.7)
MCH: 28.2 pg (ref 26.0–34.0)
MCHC: 33.1 g/dL (ref 30.0–36.0)
Neutrophils Relative %: 49 % (ref 43–77)
Platelets: 291 10*3/uL (ref 150–400)
RBC: 4.75 MIL/uL (ref 3.87–5.11)

## 2013-07-22 LAB — URINALYSIS W MICROSCOPIC + REFLEX CULTURE
Hgb urine dipstick: NEGATIVE
Leukocytes, UA: NEGATIVE
Nitrite: NEGATIVE
Protein, ur: NEGATIVE mg/dL
Urobilinogen, UA: 0.2 mg/dL (ref 0.0–1.0)

## 2013-08-12 ENCOUNTER — Telehealth: Payer: Self-pay | Admitting: *Deleted

## 2013-08-12 MED ORDER — FLUCONAZOLE 150 MG PO TABS: 150.0000 mg | ORAL_TABLET | Freq: Once | ORAL | Status: DC

## 2013-08-12 NOTE — Telephone Encounter (Signed)
Pt informed with the below note, rx sent. 

## 2013-08-12 NOTE — Telephone Encounter (Signed)
Pt called requesting diflucan for yeast infection, itching, white discharge. Pt last seen on 06/29/13 for annual. Please advise

## 2013-08-12 NOTE — Telephone Encounter (Signed)
Please call in Diflucan 150mg , review office visit if no relief.

## 2013-08-25 ENCOUNTER — Ambulatory Visit: Payer: BC Managed Care – PPO | Admitting: Internal Medicine

## 2013-10-28 ENCOUNTER — Other Ambulatory Visit: Payer: Self-pay

## 2013-10-28 ENCOUNTER — Telehealth: Payer: Self-pay | Admitting: *Deleted

## 2013-10-28 DIAGNOSIS — R937 Abnormal findings on diagnostic imaging of other parts of musculoskeletal system: Secondary | ICD-10-CM

## 2013-10-28 NOTE — Telephone Encounter (Signed)
Pt called requesting dexa order placed for breast center to schedule. Order placed per note on 06/29/13.

## 2013-11-07 ENCOUNTER — Other Ambulatory Visit: Payer: Self-pay

## 2013-11-07 DIAGNOSIS — Z1231 Encounter for screening mammogram for malignant neoplasm of breast: Secondary | ICD-10-CM

## 2013-11-23 ENCOUNTER — Ambulatory Visit
Admission: RE | Admit: 2013-11-23 | Discharge: 2013-11-23 | Disposition: A | Discharge status: Home / Self Care | Payer: Self-pay | Source: Ambulatory Visit

## 2013-11-23 DIAGNOSIS — Z1231 Encounter for screening mammogram for malignant neoplasm of breast: Secondary | ICD-10-CM

## 2013-12-29 ENCOUNTER — Other Ambulatory Visit: Payer: Self-pay | Admitting: Gynecology

## 2013-12-29 DIAGNOSIS — Z13820 Encounter for screening for osteoporosis: Secondary | ICD-10-CM

## 2013-12-29 DIAGNOSIS — R937 Abnormal findings on diagnostic imaging of other parts of musculoskeletal system: Secondary | ICD-10-CM

## 2014-01-31 ENCOUNTER — Ambulatory Visit (INDEPENDENT_AMBULATORY_CARE_PROVIDER_SITE_OTHER): Payer: BC Managed Care – PPO

## 2014-01-31 DIAGNOSIS — Z13820 Encounter for screening for osteoporosis: Secondary | ICD-10-CM

## 2014-01-31 DIAGNOSIS — R937 Abnormal findings on diagnostic imaging of other parts of musculoskeletal system: Secondary | ICD-10-CM

## 2014-02-01 ENCOUNTER — Other Ambulatory Visit: Payer: Self-pay | Admitting: Gynecology

## 2014-02-01 DIAGNOSIS — Z13820 Encounter for screening for osteoporosis: Secondary | ICD-10-CM

## 2014-06-30 ENCOUNTER — Encounter: Payer: Self-pay | Admitting: Women's Health

## 2014-06-30 ENCOUNTER — Other Ambulatory Visit (HOSPITAL_COMMUNITY)
Admission: RE | Admit: 2014-06-30 | Discharge: 2014-06-30 | Disposition: A | Discharge status: Home / Self Care | Payer: BC Managed Care – PPO | Source: Ambulatory Visit | Attending: Women's Health | Admitting: Women's Health

## 2014-06-30 ENCOUNTER — Ambulatory Visit (INDEPENDENT_AMBULATORY_CARE_PROVIDER_SITE_OTHER): Payer: BC Managed Care – PPO | Admitting: Women's Health

## 2014-06-30 VITALS — BP 120/86 | Ht 65.0 in | Wt 160.0 lb

## 2014-06-30 DIAGNOSIS — Z01419 Encounter for gynecological examination (general) (routine) without abnormal findings: Secondary | ICD-10-CM

## 2014-06-30 DIAGNOSIS — Z1151 Encounter for screening for human papillomavirus (HPV): Secondary | ICD-10-CM | POA: Insufficient documentation

## 2014-06-30 DIAGNOSIS — Z13220 Encounter for screening for lipoid disorders: Secondary | ICD-10-CM

## 2014-06-30 DIAGNOSIS — E079 Disorder of thyroid, unspecified: Secondary | ICD-10-CM

## 2014-06-30 DIAGNOSIS — Z833 Family history of diabetes mellitus: Secondary | ICD-10-CM

## 2014-06-30 DIAGNOSIS — Z124 Encounter for screening for malignant neoplasm of cervix: Secondary | ICD-10-CM | POA: Insufficient documentation

## 2014-06-30 LAB — CBC WITH DIFFERENTIAL/PLATELET
Basophils Absolute: 0 10*3/uL (ref 0.0–0.1)
Basophils Relative: 1 % (ref 0–1)
EOS ABS: 0.2 10*3/uL (ref 0.0–0.7)
EOS PCT: 5 % (ref 0–5)
HEMATOCRIT: 40.2 % (ref 36.0–46.0)
Hemoglobin: 13.6 g/dL (ref 12.0–15.0)
LYMPHS ABS: 1.4 10*3/uL (ref 0.7–4.0)
LYMPHS PCT: 43 % (ref 12–46)
MCH: 28.6 pg (ref 26.0–34.0)
MCHC: 33.8 g/dL (ref 30.0–36.0)
MCV: 84.5 fL (ref 78.0–100.0)
MONO ABS: 0.3 10*3/uL (ref 0.1–1.0)
Monocytes Relative: 9 % (ref 3–12)
Neutro Abs: 1.4 10*3/uL — ABNORMAL LOW (ref 1.7–7.7)
Neutrophils Relative %: 42 % — ABNORMAL LOW (ref 43–77)
Platelets: 250 10*3/uL (ref 150–400)
RBC: 4.76 MIL/uL (ref 3.87–5.11)
RDW: 13.4 % (ref 11.5–15.5)
WBC: 3.3 10*3/uL — ABNORMAL LOW (ref 4.0–10.5)

## 2014-06-30 NOTE — Patient Instructions (Signed)
Health Recommendations for Postmenopausal Women Respected and ongoing research has looked at the most common causes of death, disability, and poor quality of life in postmenopausal women. The causes include heart disease, diseases of blood vessels, diabetes, depression, cancer, and bone loss (osteoporosis). Many things can be done to help lower the chances of developing these and other common problems. CARDIOVASCULAR DISEASE Heart Disease: A heart attack is a medical emergency. Know the signs and symptoms of a heart attack. Below are things women can do to reduce their risk for heart disease.   Do not smoke. If you smoke, quit.  Aim for a healthy weight. Being overweight causes many preventable deaths. Eat a healthy and balanced diet and drink an adequate amount of liquids.  Get moving. Make a commitment to be more physically active. Aim for 30 minutes of activity on most, if not all days of the week.  Eat for heart health. Choose a diet that is low in saturated fat and cholesterol and eliminate trans fat. Include whole grains, vegetables, and fruits. Read and understand the labels on food containers before buying.  Know your numbers. Ask your caregiver to check your blood pressure, cholesterol (total, HDL, LDL, triglycerides) and blood glucose. Work with your caregiver on improving your entire clinical picture.  High blood pressure. Limit or stop your table salt intake (try salt substitute and food seasonings). Avoid salty foods and drinks. Read labels on food containers before buying. Eating well and exercising can help control high blood pressure. STROKE  Stroke is a medical emergency. Stroke may be the result of a blood clot in a blood vessel in the brain or by a brain hemorrhage (bleeding). Know the signs and symptoms of a stroke. To lower the risk of developing a stroke:  Avoid fatty foods.  Quit smoking.  Control your diabetes, blood pressure, and irregular heart rate. THROMBOPHLEBITIS  (BLOOD CLOT) OF THE LEG  Becoming overweight and leading a stationary lifestyle may also contribute to developing blood clots. Controlling your diet and exercising will help lower the risk of developing blood clots. CANCER SCREENING  Breast Cancer: Take steps to reduce your risk of breast cancer.  You should practice "breast self-awareness." This means understanding the normal appearance and feel of your breasts and should include breast self-examination. Any changes detected, no matter how small, should be reported to your caregiver.  After age 40, you should have a clinical breast exam (CBE) every year.  Starting at age 40, you should consider having a mammogram (breast X-ray) every year.  If you have a family history of breast cancer, talk to your caregiver about genetic screening.  If you are at high risk for breast cancer, talk to your caregiver about having an MRI and a mammogram every year.  Intestinal or Stomach Cancer: Tests to consider are a rectal exam, fecal occult blood, sigmoidoscopy, and colonoscopy. Women who are high risk may need to be screened at an earlier age and more often.  Cervical Cancer:  Beginning at age 30, you should have a Pap test every 3 years as long as the past 3 Pap tests have been normal.  If you have had past treatment for cervical cancer or a condition that could lead to cancer, you need Pap tests and screening for cancer for at least 20 years after your treatment.  If you had a hysterectomy for a problem that was not cancer or a condition that could lead to cancer, then you no longer need Pap tests.    If you are between ages 65 and 70, and you have had normal Pap tests going back 10 years, you no longer need Pap tests.  If Pap tests have been discontinued, risk factors (such as a new sexual partner) need to be reassessed to determine if screening should be resumed.  Some medical problems can increase the chance of getting cervical cancer. In these  cases, your caregiver may recommend more frequent screening and Pap tests.  Uterine Cancer: If you have vaginal bleeding after reaching menopause, you should notify your caregiver.  Ovarian Cancer: Other than yearly pelvic exams, there are no reliable tests available to screen for ovarian cancer at this time except for yearly pelvic exams.  Lung Cancer: Yearly chest X-rays can detect lung cancer and should be done on high risk women, such as cigarette smokers and women with chronic lung disease (emphysema).  Skin Cancer: A complete body skin exam should be done at your yearly examination. Avoid overexposure to the sun and ultraviolet light lamps. Use a strong sun block cream when in the sun. All of these things are important for lowering the risk of skin cancer. MENOPAUSE Menopause Symptoms: Hormone therapy products are effective for treating symptoms associated with menopause:  Moderate to severe hot flashes.  Night sweats.  Mood swings.  Headaches.  Tiredness.  Loss of sex drive.  Insomnia.  Other symptoms. Hormone replacement carries certain risks, especially in older women. Women who use or are thinking about using estrogen or estrogen with progestin treatments should discuss that with their caregiver. Your caregiver will help you understand the benefits and risks. The ideal dose of hormone replacement therapy is not known. The Food and Drug Administration (FDA) has concluded that hormone therapy should be used only at the lowest doses and for the shortest amount of time to reach treatment goals.  OSTEOPOROSIS Protecting Against Bone Loss and Preventing Fracture If you use hormone therapy for prevention of bone loss (osteoporosis), the risks for bone loss must outweigh the risk of the therapy. Ask your caregiver about other medications known to be safe and effective for preventing bone loss and fractures. To guard against bone loss or fractures, the following is recommended:  If  you are younger than age 50, take 1000 mg of calcium and at least 600 mg of Vitamin D per day.  If you are older than age 50 but younger than age 70, take 1200 mg of calcium and at least 600 mg of Vitamin D per day.  If you are older than age 70, take 1200 mg of calcium and at least 800 mg of Vitamin D per day. Smoking and excessive alcohol intake increases the risk of osteoporosis. Eat foods rich in calcium and vitamin D and do weight bearing exercises several times a week as your caregiver suggests. DIABETES Diabetes Mellitus: If you have type I or type 2 diabetes, you should keep your blood sugar under control with diet, exercise, and recommended medication. Avoid starchy and fatty foods, and too many sweets. Being overweight can make diabetes control more difficult. COGNITION AND MEMORY Cognition and Memory: Menopausal hormone therapy is not recommended for the prevention of cognitive disorders such as Alzheimer's disease or memory loss.  DEPRESSION  Depression may occur at any age, but it is common in elderly women. This may be because of physical, medical, social (loneliness), or financial problems and needs. If you are experiencing depression because of medical problems and control of symptoms, talk to your caregiver about this. Physical   activity and exercise may help with mood and sleep. Community and volunteer involvement may improve your sense of value and worth. If you have depression and you feel that the problem is getting worse or becoming severe, talk to your caregiver about which treatment options are best for you. ACCIDENTS  Accidents are common and can be serious in elderly woman. Prepare your house to prevent accidents. Eliminate throw rugs, place hand bars in bath, shower, and toilet areas. Avoid wearing high heeled shoes or walking on wet, snowy, and icy areas. Limit or stop driving if you have vision or hearing problems, or if you feel you are unsteady with your movements and  reflexes. HEPATITIS C Hepatitis C is a type of viral infection affecting the liver. It is spread mainly through contact with blood from an infected person. It can be treated, but if left untreated, it can lead to severe liver damage over the years. Many people who are infected do not know that the virus is in their blood. If you are a "baby-boomer", it is recommended that you have one screening test for Hepatitis C. IMMUNIZATIONS  Several immunizations are important to consider having during your senior years, including:   Tetanus, diphtheria, and pertussis booster shot.  Influenza every year before the flu season begins.  Pneumonia vaccine.  Shingles vaccine.  Others, as indicated based on your specific needs. Talk to your caregiver about these. Document Released: 12/05/2005 Document Revised: 02/27/2014 Document Reviewed: 07/31/2008 ExitCare Patient Information 2015 [employer]. This information is not intended to replace advice given to you by your health care provider. Make sure you discuss any questions you have with your health care provider.  

## 2014-06-30 NOTE — Progress Notes (Signed)
Brenda Hurst Oct 02, 1961 [id number]    History:    Presents for annual exam.  Postmenopausal/no bleeding/no HRT. 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 Normal mammogram history. 01/2014 DEXA T score -0.6 left femoral neck. Has not had a colonoscopy.  Past medical history, past surgical history, family history and social history were all reviewed and documented in the EPIC chart. Works as a Programmer, multimedia. Andrew 13. Father died of liver disease, mother died of asthma.  ROS:  A  12 point ROS was performed and pertinent positives and negatives are included.  Exam:  Filed Vitals:   06/30/14 0815  BP: 120/86    General appearance:  Normal Thyroid:  Symmetrical, normal in size, without palpable masses or nodularity. Respiratory  Auscultation:  Clear without wheezing or rhonchi Cardiovascular  Auscultation:  Regular rate, without rubs, murmurs or gallops  Edema/varicosities:  Not grossly evident Abdominal  Soft,nontender, without masses, guarding or rebound.  Liver/spleen:  No organomegaly noted  Hernia:  None appreciated  Skin  Inspection:  Grossly normal   Breasts: Examined lying and sitting.     Right: Without masses, retractions, discharge or axillary adenopathy.     Left: Without masses, retractions, discharge or axillary adenopathy. Gentitourinary   Inguinal/mons:  Normal without inguinal adenopathy  External genitalia:  Normal  BUS/Urethra/Skene's glands:  Normal  Vagina:  Normal  Cervix:  Normal  Uterus:   normal in size, shape and contour.  Midline and mobile  Adnexa/parametria:     Rt: Without masses or tenderness.   Lt: Without masses or tenderness.  Anus and perineum: Normal  Digital rectal exam: Normal sphincter tone without palpated masses or tenderness  Assessment/Plan:  53 y.o. MBF G1P1 for annual exam.   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  Plan: SBE's, continue annual mammogram, calcium rich diet, vitamin D 2000 daily encouraged. Increase regular exercise and decrease calories has had a steady weight gain in the past few years. CBC, glucose, lipid panel, TSH, Hep C, UA, Pap with high-risk HPV typing. Reviewed importance of screening colonoscopy, [facility] information given.   Note: This dictation was prepared with Dragon/digital dictation.  Any transcriptional errors that result are unintentional. Harrington Challenger [facility], 8:33 AM 06/30/2014

## 2014-06-30 NOTE — Addendum Note (Signed)
Addended by: Aura Camps on: 06/30/2014 08:43 AM   Modules accepted: Orders

## 2014-07-01 LAB — URINALYSIS W MICROSCOPIC + REFLEX CULTURE
BACTERIA UA: NONE SEEN
Bilirubin Urine: NEGATIVE
CASTS: NONE SEEN
CRYSTALS: NONE SEEN
GLUCOSE, UA: NEGATIVE mg/dL
Hgb urine dipstick: NEGATIVE
Ketones, ur: NEGATIVE mg/dL
Nitrite: NEGATIVE
Protein, ur: NEGATIVE mg/dL
SPECIFIC GRAVITY, URINE: 1.021 (ref 1.005–1.030)
SQUAMOUS EPITHELIAL / LPF: NONE SEEN
UROBILINOGEN UA: 0.2 mg/dL (ref 0.0–1.0)
pH: 6 (ref 5.0–8.0)

## 2014-07-01 LAB — HEPATITIS C ANTIBODY: HCV Ab: NEGATIVE

## 2014-07-01 LAB — LIPID PANEL
CHOL/HDL RATIO: 2.4 ratio
Cholesterol: 185 mg/dL (ref 0–200)
HDL: 78 mg/dL (ref >39)
LDL Cholesterol: 91 mg/dL (ref 0–99)
Triglycerides: 81 mg/dL (ref <150)
VLDL: 16 mg/dL (ref 0–40)

## 2014-07-01 LAB — GLUCOSE, RANDOM: Glucose, Bld: 92 mg/dL (ref 70–99)

## 2014-07-01 LAB — TSH: TSH: 1.243 u[IU]/mL (ref 0.350–4.500)

## 2014-07-02 LAB — URINE CULTURE

## 2014-07-05 LAB — CYTOLOGY - PAP

## 2014-08-10 ENCOUNTER — Ambulatory Visit (INDEPENDENT_AMBULATORY_CARE_PROVIDER_SITE_OTHER): Payer: BC Managed Care – PPO | Admitting: Women's Health

## 2014-08-10 ENCOUNTER — Other Ambulatory Visit (HOSPITAL_COMMUNITY)
Admission: RE | Admit: 2014-08-10 | Discharge: 2014-08-10 | Disposition: A | Discharge status: Home / Self Care | Payer: BC Managed Care – PPO | Source: Ambulatory Visit | Attending: Women's Health | Admitting: Women's Health

## 2014-08-10 DIAGNOSIS — Z1151 Encounter for screening for human papillomavirus (HPV): Secondary | ICD-10-CM | POA: Diagnosis present

## 2014-08-10 DIAGNOSIS — Z01419 Encounter for gynecological examination (general) (routine) without abnormal findings: Secondary | ICD-10-CM | POA: Insufficient documentation

## 2014-08-10 DIAGNOSIS — R87615 Unsatisfactory cytologic smear of cervix: Secondary | ICD-10-CM

## 2014-08-10 DIAGNOSIS — Z124 Encounter for screening for malignant neoplasm of cervix: Secondary | ICD-10-CM

## 2014-08-10 NOTE — Progress Notes (Signed)
Patient ID: Brenda Hurst, female   DOB: 06/25/1961, 53 y.o.   MRN: [id number] Presents for a Pap, Pap in annual exam was acellular. 2012 ascus with positive HR HPV, C&B confirming CIN-1. Normal Paps after. Postmenopausal/no HRT/no bleeding.  Exam: Appears well. External genitalia within normal limits. Speculum exam no visible discharge. Pap taken.  History of CIN-1 positive HR HPV  Plan Pap with high-risk HPV typing. New guidelines reviewed.

## 2014-08-10 NOTE — Addendum Note (Signed)
Addended by: Dayna BarkerGARDNER, Woodrow Dulski K on: 08/10/2014 08:32 AM   Modules accepted: Orders

## 2014-08-14 LAB — CYTOLOGY - PAP

## 2014-08-28 ENCOUNTER — Encounter: Payer: Self-pay | Admitting: Women's Health

## 2014-10-18 ENCOUNTER — Other Ambulatory Visit: Payer: Self-pay

## 2014-10-18 DIAGNOSIS — Z1231 Encounter for screening mammogram for malignant neoplasm of breast: Secondary | ICD-10-CM

## 2014-11-24 ENCOUNTER — Ambulatory Visit
Admission: RE | Admit: 2014-11-24 | Discharge: 2014-11-24 | Disposition: A | Discharge status: Home / Self Care | Payer: BC Managed Care – PPO | Source: Ambulatory Visit

## 2014-11-24 DIAGNOSIS — Z1231 Encounter for screening mammogram for malignant neoplasm of breast: Secondary | ICD-10-CM

## 2015-02-26 ENCOUNTER — Telehealth: Payer: Self-pay

## 2015-02-26 NOTE — Telephone Encounter (Signed)
Patient is needing colonoscopy and said she tried to call to schedule but they told her she needs referral.  She wants to schedule after June 10th after school is out.  I told her I will let Victorino DikeJennifer know and she will arrange referral/appt and she should hear from someone soon.

## 2015-02-27 ENCOUNTER — Telehealth: Payer: Self-pay | Admitting: *Deleted

## 2015-02-27 DIAGNOSIS — Z1211 Encounter for screening for malignant neoplasm of colon: Secondary | ICD-10-CM

## 2015-02-27 NOTE — Telephone Encounter (Signed)
-----   Message from Keenan BachelorKatherine R Annas, Arizona[place]MA sent at 02/26/2015  3:12 PM EDT ----- Regarding: referral for colonoscopy after June 10 Per [place] colon[place]. Patient ready for appt after June 10th.

## 2015-02-27 NOTE — Telephone Encounter (Signed)
Referral placed at [facility] they will contact pt to schedule. 

## 2015-03-01 ENCOUNTER — Encounter: Payer: Self-pay | Admitting: Internal Medicine

## 2015-03-01 NOTE — Telephone Encounter (Signed)
Appointment 04/24/18 @ 8:30am

## 2015-04-25 ENCOUNTER — Ambulatory Visit (AMBULATORY_SURGERY_CENTER): Payer: Self-pay | Admitting: *Deleted

## 2015-04-25 VITALS — Ht 65.5 in | Wt 161.0 lb

## 2015-04-25 DIAGNOSIS — Z1211 Encounter for screening for malignant neoplasm of colon: Secondary | ICD-10-CM

## 2015-04-25 NOTE — Progress Notes (Signed)
No diet pills No home 02' No issues with past sedation No issues with eggs or soy, no allergy emmi declined

## 2015-04-26 ENCOUNTER — Encounter: Payer: Self-pay | Admitting: Internal Medicine

## 2015-05-09 ENCOUNTER — Ambulatory Visit (AMBULATORY_SURGERY_CENTER): Payer: BC Managed Care – PPO | Admitting: Internal Medicine

## 2015-05-09 ENCOUNTER — Encounter: Payer: Self-pay | Admitting: Internal Medicine

## 2015-05-09 VITALS — BP 119/52 | HR 71 | Temp 96.9°F | Resp 18 | Ht 65.0 in | Wt 161.0 lb

## 2015-05-09 DIAGNOSIS — Z1211 Encounter for screening for malignant neoplasm of colon: Secondary | ICD-10-CM

## 2015-05-09 MED ORDER — SODIUM CHLORIDE 0.9 % IV SOLN: 500.0000 mL | INTRAVENOUS | Status: DC

## 2015-05-09 NOTE — Op Note (Signed)
[facility] [address] [place], [id number]   COLONOSCOPY PROCEDURE REPORT  PATIENT: Brenda Hurst, Brenda Hurst  MR#: [id number] BIRTHDATE: [birth year] , 54  yrs. old GENDER: female ENDOSCOPIST: Hart Carwinora M Tonya Carlile, MD REFERRED XB:JYNWGBY:Nancy Maple HudsonYoung, M.D. PROCEDURE DATE:  05/09/2015 PROCEDURE:   Colonoscopy, screening First Screening Colonoscopy - Avg.  risk and is 50 yrs.  old or older Yes.  Prior Negative Screening - Now for repeat screening. N/A  History of Adenoma - Now for follow-up colonoscopy & has been > or = to 3 yrs.  N/A  Polyps removed today? No Recommend repeat exam, <10 yrs? No ASA CLASS:   Class II INDICATIONS:Screening for colonic neoplasia and Colorectal Neoplasm Risk Assessment for this procedure is average risk. MEDICATIONS: Monitored anesthesia care and Propofol 400 mg IV  DESCRIPTION OF PROCEDURE:   After the risks benefits and alternatives of the procedure were thoroughly explained, informed consent was obtained.  The digital rectal exam revealed no abnormalities of the rectum.   The LB 1528  endoscope was introduced through the anus and advanced to the cecum, which was identified by both the appendix and ileocecal valve. No adverse events experienced.   The quality of the prep was good.  (MoviPrep was used)  The instrument was then slowly withdrawn as the colon was fully examined. Estimated blood loss is zero unless otherwise noted in this procedure report.      COLON FINDINGS: A normal appearing cecum, ileocecal valve, and appendiceal orifice were identified.  The ascending, transverse, descending, sigmoid colon, and rectum appeared unremarkable. Retroflexed views revealed no abnormalities. The time to cecum = 8.38 Withdrawal time = 6.51   The scope was withdrawn and the procedure completed. COMPLICATIONS: There were no immediate complications.  ENDOSCOPIC IMPRESSION: Normal colonoscopy  RECOMMENDATIONS: High fiber diet Recall colonoscopy in 10  years  eSigned:  Hart Carwinora M Andreika Vandagriff, MD 05/09/2015 10:55 AM   cc:

## 2015-05-09 NOTE — Patient Instructions (Signed)
Discharge instructions given. Normal exam. Resume previous medications. YOU HAD AN ENDOSCOPIC PROCEDURE TODAY AT THE [facility]:   Refer to the procedure report that was given to you for any specific questions about what was found during the examination.  If the procedure report does not answer your questions, please call your gastroenterologist to clarify.  If you requested that your care partner not be given the details of your procedure findings, then the procedure report has been included in a sealed envelope for you to review at your convenience later.  YOU SHOULD EXPECT: Some feelings of bloating in the abdomen. Passage of more gas than usual.  Walking can help get rid of the air that was put into your GI tract during the procedure and reduce the bloating. If you had a lower endoscopy (such as a colonoscopy or flexible sigmoidoscopy) you may notice spotting of blood in your stool or on the toilet paper. If you underwent a bowel prep for your procedure, you may not have a normal bowel movement for a few days.  Please Note:  You might notice some irritation and congestion in your nose or some drainage.  This is from the oxygen used during your procedure.  There is no need for concern and it should clear up in a day or so.  SYMPTOMS TO REPORT IMMEDIATELY:   Following lower endoscopy (colonoscopy or flexible sigmoidoscopy):  Excessive amounts of blood in the stool  Significant tenderness or worsening of abdominal pains  Swelling of the abdomen that is new, acute  Fever of 100F or higher   For urgent or emergent issues, a gastroenterologist can be reached at any hour by calling [phone].   DIET: Your first meal following the procedure should be a small meal and then it is ok to progress to your normal diet. Heavy or fried foods are harder to digest and may make you feel nauseous or bloated.  Likewise, meals heavy in dairy and vegetables can increase bloating.  Drink plenty  of fluids but you should avoid alcoholic beverages for 24 hours.  ACTIVITY:  You should plan to take it easy for the rest of today and you should NOT DRIVE or use heavy machinery until tomorrow (because of the sedation medicines used during the test).    FOLLOW UP: Our staff will call the number listed on your records the next business day following your procedure to check on you and address any questions or concerns that you may have regarding the information given to you following your procedure. If we do not reach you, we will leave a message.  However, if you are feeling well and you are not experiencing any problems, there is no need to return our call.  We will assume that you have returned to your regular daily activities without incident.  If any biopsies were taken you will be contacted by phone or by letter within the next 1-3 weeks.  Please call us at [phone] if you have not heard about the biopsies in 3 weeks.    SIGNATURES/CONFIDENTIALITY: You and/or your care partner have signed paperwork which will be entered into your electronic medical record.  These signatures attest to the fact that that the information above on your After Visit Summary has been reviewed and is understood.  Full responsibility of the confidentiality of this discharge information lies with you and/or your care-partner. 

## 2015-05-09 NOTE — Progress Notes (Addendum)
Patient crying and jerking when trying to start IV. Yelling out loud. States" You hit the bone". Allen Derryracy Ennis,RN assures patient IV is not in the bone. Placed a warm pack on hand after IV removed. Harriett Sineancy called to start IV, Patient crying out loud again when ShambaughNancy stuck her. Patient reassured by Harriett SineNancy that IV was in place. Patient lay quietly.

## 2015-05-09 NOTE — Progress Notes (Signed)
Stable to RR 

## 2015-05-10 ENCOUNTER — Telehealth: Payer: Self-pay | Admitting: Emergency Medicine

## 2015-05-10 NOTE — Telephone Encounter (Signed)
  Follow up Call-  Call back number 05/09/2015  Post procedure Call Back phone  # [phone]  Permission to leave phone message Yes     Patient questions:  Do you have a fever, pain , or abdominal swelling? No. Pain Score  0 *  Have you tolerated food without any problems? Yes.    Have you been able to return to your normal activities? Yes.    Do you have any questions about your discharge instructions: Diet   No. Medications  No. Follow up visit  No.  Do you have questions or concerns about your Care? No.  Actions: * If pain score is 4 or above: No action needed, pain <4.

## 2015-07-11 ENCOUNTER — Ambulatory Visit (INDEPENDENT_AMBULATORY_CARE_PROVIDER_SITE_OTHER): Payer: BC Managed Care – PPO | Admitting: Women's Health

## 2015-07-11 ENCOUNTER — Encounter: Payer: Self-pay | Admitting: Women's Health

## 2015-07-11 VITALS — BP 118/78 | Ht 65.0 in | Wt 161.0 lb

## 2015-07-11 DIAGNOSIS — Z01419 Encounter for gynecological examination (general) (routine) without abnormal findings: Secondary | ICD-10-CM | POA: Diagnosis not present

## 2015-07-11 LAB — CBC WITH DIFFERENTIAL/PLATELET
BASOS PCT: 1 % (ref 0–1)
Basophils Absolute: 0 10*3/uL (ref 0.0–0.1)
Eosinophils Absolute: 0.1 10*3/uL (ref 0.0–0.7)
Eosinophils Relative: 3 % (ref 0–5)
HCT: 41.4 % (ref 36.0–46.0)
HEMOGLOBIN: 13.4 g/dL (ref 12.0–15.0)
Lymphocytes Relative: 41 % (ref 12–46)
Lymphs Abs: 1.2 10*3/uL (ref 0.7–4.0)
MCH: 28.3 pg (ref 26.0–34.0)
MCHC: 32.4 g/dL (ref 30.0–36.0)
MCV: 87.3 fL (ref 78.0–100.0)
MONO ABS: 0.2 10*3/uL (ref 0.1–1.0)
MONOS PCT: 8 % (ref 3–12)
MPV: 10.1 fL (ref 8.6–12.4)
NEUTROS ABS: 1.4 10*3/uL — AB (ref 1.7–7.7)
Neutrophils Relative %: 47 % (ref 43–77)
Platelets: 261 10*3/uL (ref 150–400)
RBC: 4.74 MIL/uL (ref 3.87–5.11)
RDW: 13.5 % (ref 11.5–15.5)
WBC: 2.9 10*3/uL — ABNORMAL LOW (ref 4.0–10.5)

## 2015-07-11 LAB — COMPREHENSIVE METABOLIC PANEL
ALT: 20 U/L (ref 6–29)
AST: 21 U/L (ref 10–35)
Albumin: 4.4 g/dL (ref 3.6–5.1)
Alkaline Phosphatase: 60 U/L (ref 33–130)
BUN: 11 mg/dL (ref 7–25)
CALCIUM: 9.7 mg/dL (ref 8.6–10.4)
CO2: 29 mmol/L (ref 20–31)
Chloride: 104 mmol/L (ref 98–110)
Creat: 0.63 mg/dL (ref 0.50–1.05)
GLUCOSE: 94 mg/dL (ref 65–99)
Potassium: 4.8 mmol/L (ref 3.5–5.3)
SODIUM: 139 mmol/L (ref 135–146)
Total Bilirubin: 0.4 mg/dL (ref 0.2–1.2)
Total Protein: 7.4 g/dL (ref 6.1–8.1)

## 2015-07-11 NOTE — Patient Instructions (Signed)
Health Recommendations for Postmenopausal Women Respected and ongoing research has looked at the most common causes of death, disability, and poor quality of life in postmenopausal women. The causes include heart disease, diseases of blood vessels, diabetes, depression, cancer, and bone loss (osteoporosis). Many things can be done to help lower the chances of developing these and other common problems. CARDIOVASCULAR DISEASE Heart Disease: A heart attack is a medical emergency. Know the signs and symptoms of a heart attack. Below are things women can do to reduce their risk for heart disease.   Do not smoke. If you smoke, quit.  Aim for a healthy weight. Being overweight causes many preventable deaths. Eat a healthy and balanced diet and drink an adequate amount of liquids.  Get moving. Make a commitment to be more physically active. Aim for 30 minutes of activity on most, if not all days of the week.  Eat for heart health. Choose a diet that is low in saturated fat and cholesterol and eliminate trans fat. Include whole grains, vegetables, and fruits. Read and understand the labels on food containers before buying.  Know your numbers. Ask your caregiver to check your blood pressure, cholesterol (total, HDL, LDL, triglycerides) and blood glucose. Work with your caregiver on improving your entire clinical picture.  High blood pressure. Limit or stop your table salt intake (try salt substitute and food seasonings). Avoid salty foods and drinks. Read labels on food containers before buying. Eating well and exercising can help control high blood pressure. STROKE  Stroke is a medical emergency. Stroke may be the result of a blood clot in a blood vessel in the brain or by a brain hemorrhage (bleeding). Know the signs and symptoms of a stroke. To lower the risk of developing a stroke:  Avoid fatty foods.  Quit smoking.  Control your diabetes, blood pressure, and irregular heart rate. THROMBOPHLEBITIS  (BLOOD CLOT) OF THE LEG  Becoming overweight and leading a stationary lifestyle may also contribute to developing blood clots. Controlling your diet and exercising will help lower the risk of developing blood clots. CANCER SCREENING  Breast Cancer: Take steps to reduce your risk of breast cancer.  You should practice "breast self-awareness." This means understanding the normal appearance and feel of your breasts and should include breast self-examination. Any changes detected, no matter how small, should be reported to your caregiver.  After age 40, you should have a clinical breast exam (CBE) every year.  Starting at age 40, you should consider having a mammogram (breast X-ray) every year.  If you have a family history of breast cancer, talk to your caregiver about genetic screening.  If you are at high risk for breast cancer, talk to your caregiver about having an MRI and a mammogram every year.  Intestinal or Stomach Cancer: Tests to consider are a rectal exam, fecal occult blood, sigmoidoscopy, and colonoscopy. Women who are high risk may need to be screened at an earlier age and more often.  Cervical Cancer:  Beginning at age 30, you should have a Pap test every 3 years as long as the past 3 Pap tests have been normal.  If you have had past treatment for cervical cancer or a condition that could lead to cancer, you need Pap tests and screening for cancer for at least 20 years after your treatment.  If you had a hysterectomy for a problem that was not cancer or a condition that could lead to cancer, then you no longer need Pap tests.    If you are between ages 65 and 70, and you have had normal Pap tests going back 10 years, you no longer need Pap tests.  If Pap tests have been discontinued, risk factors (such as a new sexual partner) need to be reassessed to determine if screening should be resumed.  Some medical problems can increase the chance of getting cervical cancer. In these  cases, your caregiver may recommend more frequent screening and Pap tests.  Uterine Cancer: If you have vaginal bleeding after reaching menopause, you should notify your caregiver.  Ovarian Cancer: Other than yearly pelvic exams, there are no reliable tests available to screen for ovarian cancer at this time except for yearly pelvic exams.  Lung Cancer: Yearly chest X-rays can detect lung cancer and should be done on high risk women, such as cigarette smokers and women with chronic lung disease (emphysema).  Skin Cancer: A complete body skin exam should be done at your yearly examination. Avoid overexposure to the sun and ultraviolet light lamps. Use a strong sun block cream when in the sun. All of these things are important for lowering the risk of skin cancer. MENOPAUSE Menopause Symptoms: Hormone therapy products are effective for treating symptoms associated with menopause:  Moderate to severe hot flashes.  Night sweats.  Mood swings.  Headaches.  Tiredness.  Loss of sex drive.  Insomnia.  Other symptoms. Hormone replacement carries certain risks, especially in older women. Women who use or are thinking about using estrogen or estrogen with progestin treatments should discuss that with their caregiver. Your caregiver will help you understand the benefits and risks. The ideal dose of hormone replacement therapy is not known. The Food and Drug Administration (FDA) has concluded that hormone therapy should be used only at the lowest doses and for the shortest amount of time to reach treatment goals.  OSTEOPOROSIS Protecting Against Bone Loss and Preventing Fracture If you use hormone therapy for prevention of bone loss (osteoporosis), the risks for bone loss must outweigh the risk of the therapy. Ask your caregiver about other medications known to be safe and effective for preventing bone loss and fractures. To guard against bone loss or fractures, the following is recommended:  If  you are younger than age 50, take 1000 mg of calcium and at least 600 mg of Vitamin D per day.  If you are older than age 50 but younger than age 70, take 1200 mg of calcium and at least 600 mg of Vitamin D per day.  If you are older than age 70, take 1200 mg of calcium and at least 800 mg of Vitamin D per day. Smoking and excessive alcohol intake increases the risk of osteoporosis. Eat foods rich in calcium and vitamin D and do weight bearing exercises several times a week as your caregiver suggests. DIABETES Diabetes Mellitus: If you have type I or type 2 diabetes, you should keep your blood sugar under control with diet, exercise, and recommended medication. Avoid starchy and fatty foods, and too many sweets. Being overweight can make diabetes control more difficult. COGNITION AND MEMORY Cognition and Memory: Menopausal hormone therapy is not recommended for the prevention of cognitive disorders such as Alzheimer's disease or memory loss.  DEPRESSION  Depression may occur at any age, but it is common in elderly women. This may be because of physical, medical, social (loneliness), or financial problems and needs. If you are experiencing depression because of medical problems and control of symptoms, talk to your caregiver about this. Physical   activity and exercise may help with mood and sleep. Community and volunteer involvement may improve your sense of value and worth. If you have depression and you feel that the problem is getting worse or becoming severe, talk to your caregiver about which treatment options are best for you. ACCIDENTS  Accidents are common and can be serious in elderly woman. Prepare your house to prevent accidents. Eliminate throw rugs, place hand bars in bath, shower, and toilet areas. Avoid wearing high heeled shoes or walking on wet, snowy, and icy areas. Limit or stop driving if you have vision or hearing problems, or if you feel you are unsteady with your movements and  reflexes. HEPATITIS C Hepatitis C is a type of viral infection affecting the liver. It is spread mainly through contact with blood from an infected person. It can be treated, but if left untreated, it can lead to severe liver damage over the years. Many people who are infected do not know that the virus is in their blood. If you are a "baby-boomer", it is recommended that you have one screening test for Hepatitis C. IMMUNIZATIONS  Several immunizations are important to consider having during your senior years, including:   Tetanus, diphtheria, and pertussis booster shot.  Influenza every year before the flu season begins.  Pneumonia vaccine.  Shingles vaccine.  Others, as indicated based on your specific needs. Talk to your caregiver about these. Document Released: 12/05/2005 Document Revised: 02/27/2014 Document Reviewed: 07/31/2008 ExitCare Patient Information 2015 [employer]. This information is not intended to replace advice given to you by your health care provider. Make sure you discuss any questions you have with your health care provider.  

## 2015-07-11 NOTE — Progress Notes (Signed)
Brenda Hurst 09-08-1961 [id number]    History:    Presents for annual exam.  Postmenopausal/no bleeding/no HRT. Normal mammogram history. 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 2015 T score -0.6. 04/2015 negative colonoscopy.Normal lipid panel 2015.  Past medical history, past surgical history, family history and social history were all reviewed and documented in the EPIC chart. Works at a school. Brenda Hurst 14 doing well. Father died of liver disease, mother died of asthma.  ROS:  A ROS was performed and pertinent positives and negatives are included.  Exam:  Filed Vitals:   07/11/15 0813  BP: 118/78    General appearance:  Normal Thyroid:  Symmetrical, normal in size, without palpable masses or nodularity. Respiratory  Auscultation:  Clear without wheezing or rhonchi Cardiovascular  Auscultation:  Regular rate, without rubs, murmurs or gallops  Edema/varicosities:  Not grossly evident Abdominal  Soft,nontender, without masses, guarding or rebound.  Liver/spleen:  No organomegaly noted  Hernia:  None appreciated  Skin  Inspection:  Grossly normal   Breasts: Examined lying and sitting/pendulous.     Right: Without masses, retractions, discharge or axillary adenopathy.     Left: Without masses, retractions, discharge or axillary adenopathy. Gentitourinary   Inguinal/mons:  Normal without inguinal adenopathy  External genitalia:  Normal  BUS/Urethra/Skene's glands:  Normal  Vagina:  Normal  Cervix:  Normal  Uterus:   normal in size, shape and contour.  Midline and mobile  Adnexa/parametria:     Rt: Without masses or tenderness.   Lt: Without masses or tenderness.  Anus and perineum: Normal  Digital rectal exam: Normal sphincter tone without palpated masses or tenderness  Assessment/Plan:  54 y.o. MBF G1 P1 for annual exam with no complaints.  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 Continue annual flu shot at work. CBC, CMP, vitamin D, UA, Pap normal with negative HR HPV 2015. Repeat Pap next year.  Harrington Challenger WHNP, 8:40 AM 07/11/2015

## 2015-07-12 ENCOUNTER — Other Ambulatory Visit: Payer: Self-pay | Admitting: Women's Health

## 2015-07-12 DIAGNOSIS — D72819 Decreased white blood cell count, unspecified: Secondary | ICD-10-CM

## 2015-07-12 LAB — URINALYSIS W MICROSCOPIC + REFLEX CULTURE
BACTERIA UA: NONE SEEN [HPF]
BILIRUBIN URINE: NEGATIVE
CRYSTALS: NONE SEEN [HPF]
Casts: NONE SEEN [LPF]
Glucose, UA: NEGATIVE
HGB URINE DIPSTICK: NEGATIVE
KETONES UR: NEGATIVE
Nitrite: NEGATIVE
PROTEIN: NEGATIVE
RBC / HPF: NONE SEEN RBC/HPF (ref <=2)
Specific Gravity, Urine: 1.019 (ref 1.001–1.035)
Yeast: NONE SEEN [HPF]
pH: 7 (ref 5.0–8.0)

## 2015-07-12 LAB — VITAMIN D 25 HYDROXY (VIT D DEFICIENCY, FRACTURES): Vit D, 25-Hydroxy: 27 ng/mL — ABNORMAL LOW (ref 30–100)

## 2015-07-13 LAB — URINE CULTURE

## 2015-08-10 ENCOUNTER — Other Ambulatory Visit: Payer: BC Managed Care – PPO

## 2015-08-10 DIAGNOSIS — D72819 Decreased white blood cell count, unspecified: Secondary | ICD-10-CM

## 2015-08-10 LAB — CBC WITH DIFFERENTIAL/PLATELET
BASOS ABS: 0 10*3/uL (ref 0.0–0.1)
BASOS PCT: 1 % (ref 0–1)
Eosinophils Absolute: 0.1 10*3/uL (ref 0.0–0.7)
Eosinophils Relative: 3 % (ref 0–5)
HEMATOCRIT: 39.6 % (ref 36.0–46.0)
HEMOGLOBIN: 13.1 g/dL (ref 12.0–15.0)
LYMPHS PCT: 35 % (ref 12–46)
Lymphs Abs: 1.5 10*3/uL (ref 0.7–4.0)
MCH: 28.5 pg (ref 26.0–34.0)
MCHC: 33.1 g/dL (ref 30.0–36.0)
MCV: 86.3 fL (ref 78.0–100.0)
MPV: 9.4 fL (ref 8.6–12.4)
Monocytes Absolute: 0.4 10*3/uL (ref 0.1–1.0)
Monocytes Relative: 8 % (ref 3–12)
NEUTROS ABS: 2.3 10*3/uL (ref 1.7–7.7)
Neutrophils Relative %: 53 % (ref 43–77)
Platelets: 277 10*3/uL (ref 150–400)
RBC: 4.59 MIL/uL (ref 3.87–5.11)
RDW: 13.4 % (ref 11.5–15.5)
WBC: 4.4 10*3/uL (ref 4.0–10.5)

## 2015-11-21 ENCOUNTER — Other Ambulatory Visit: Payer: Self-pay

## 2015-11-21 DIAGNOSIS — Z1231 Encounter for screening mammogram for malignant neoplasm of breast: Secondary | ICD-10-CM

## 2015-12-04 ENCOUNTER — Ambulatory Visit
Admission: RE | Admit: 2015-12-04 | Discharge: 2015-12-04 | Disposition: A | Discharge status: Home / Self Care | Payer: BC Managed Care – PPO | Source: Ambulatory Visit

## 2015-12-04 DIAGNOSIS — Z1231 Encounter for screening mammogram for malignant neoplasm of breast: Secondary | ICD-10-CM

## 2016-07-11 ENCOUNTER — Encounter: Payer: Self-pay | Admitting: Women's Health

## 2016-07-11 ENCOUNTER — Ambulatory Visit (INDEPENDENT_AMBULATORY_CARE_PROVIDER_SITE_OTHER): Payer: BC Managed Care – PPO | Admitting: Women's Health

## 2016-07-11 VITALS — BP 118/80 | Ht 65.0 in | Wt 162.0 lb

## 2016-07-11 DIAGNOSIS — Z13220 Encounter for screening for lipoid disorders: Secondary | ICD-10-CM | POA: Diagnosis not present

## 2016-07-11 DIAGNOSIS — Z01419 Encounter for gynecological examination (general) (routine) without abnormal findings: Secondary | ICD-10-CM

## 2016-07-11 LAB — LIPID PANEL
CHOL/HDL RATIO: 2.7 ratio (ref <=5.0)
CHOLESTEROL: 214 mg/dL — AB (ref 125–200)
HDL: 78 mg/dL (ref >=46)
LDL CALC: 108 mg/dL (ref <130)
TRIGLYCERIDES: 138 mg/dL (ref <150)
VLDL: 28 mg/dL (ref <30)

## 2016-07-11 LAB — CBC WITH DIFFERENTIAL/PLATELET
Basophils Absolute: 0 cells/uL (ref 0–200)
Basophils Relative: 0 %
EOS PCT: 4 %
Eosinophils Absolute: 184 cells/uL (ref 15–500)
HCT: 41.3 % (ref 35.0–45.0)
HEMOGLOBIN: 13.2 g/dL (ref 11.7–15.5)
LYMPHS ABS: 1702 {cells}/uL (ref 850–3900)
Lymphocytes Relative: 37 %
MCH: 27.8 pg (ref 27.0–33.0)
MCHC: 32 g/dL (ref 32.0–36.0)
MCV: 87.1 fL (ref 80.0–100.0)
MONOS PCT: 8 %
MPV: 9.8 fL (ref 7.5–12.5)
Monocytes Absolute: 368 cells/uL (ref 200–950)
NEUTROS PCT: 51 %
Neutro Abs: 2346 cells/uL (ref 1500–7800)
PLATELETS: 254 10*3/uL (ref 140–400)
RBC: 4.74 MIL/uL (ref 3.80–5.10)
RDW: 13.3 % (ref 11.0–15.0)
WBC: 4.6 10*3/uL (ref 3.8–10.8)

## 2016-07-11 LAB — COMPREHENSIVE METABOLIC PANEL
ALBUMIN: 4.3 g/dL (ref 3.6–5.1)
ALT: 17 U/L (ref 6–29)
AST: 17 U/L (ref 10–35)
Alkaline Phosphatase: 50 U/L (ref 33–130)
BUN: 13 mg/dL (ref 7–25)
CALCIUM: 9.4 mg/dL (ref 8.6–10.4)
CHLORIDE: 104 mmol/L (ref 98–110)
CO2: 29 mmol/L (ref 20–31)
CREATININE: 0.69 mg/dL (ref 0.50–1.05)
Glucose, Bld: 88 mg/dL (ref 65–99)
POTASSIUM: 4.4 mmol/L (ref 3.5–5.3)
SODIUM: 139 mmol/L (ref 135–146)
Total Bilirubin: 0.4 mg/dL (ref 0.2–1.2)
Total Protein: 7.1 g/dL (ref 6.1–8.1)

## 2016-07-11 NOTE — Progress Notes (Signed)
Brenda BihariLettace Helderman 09/15/1961 [id number]   History:    Presents for annual exam. Postmenopausal/no bleeding/no HRT. Normal mammogram history. 2012 ascus with (+) HR HPV CIN-1 on colposcopy and biopsy, normal Paps after. Normal DEXA 2015, T score bilateral hip average -0.5. Normal colonoscopy 04/2015.   Past medical history, past surgical history, family history and social history were all reviewed and documented in the EPIC chart. Works in Development worker, [employer] at school. Son Greig Castillandrew, 16, doing well, believes he got Gardasil at pediatrician. Husband truckdriver. Father died of liver disease, mother died of asthma  ROS:  A ROS was performed and pertinent positives and negatives are included.  Exam:  Vitals:   07/11/16 0826  BP: 118/80  Weight: 162 lb (73.5 kg)  Height: 5\' 5"  (1.651 m)   Body mass index is 26.96 kg/m.   General appearance:  Normal Thyroid:  Symmetrical, normal in size, without palpable masses or nodularity. Respiratory  Auscultation:  Clear without wheezing or rhonchi Cardiovascular  Auscultation:  Regular rate, without rubs, murmurs or gallops  Edema/varicosities:  Not grossly evident Abdominal  Soft,nontender, without masses, guarding or rebound.  Liver/spleen:  No organomegaly noted  Hernia:  None appreciated  Skin  Inspection:  Grossly normal   Breasts: Examined lying and sitting.     Right: Without masses, retractions, discharge or axillary adenopathy.     Left: Without masses, retractions, discharge or axillary adenopathy. Gentitourinary   Inguinal/mons:  Normal without inguinal adenopathy  External genitalia:  Normal  BUS/Urethra/Skene's glands:  Normal  Vagina:  Normal  Cervix:  Normal  Uterus:  Normal in size, shape and contour.  Midline and mobile  Adnexa/parametria:     Rt: Without masses or tenderness.   Lt: Without masses or tenderness.  Anus and perineum: Normal  Digital rectal exam: Normal sphincter tone without palpated masses or  tenderness  Assessment/Plan:  55 y.o.  MBF G1P1 for annual exam without complaints.  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 Encouraged increased cardiovascular exercise, vit. D 1000 daily, healthy diet. Continue annual flu shot at work. Pap HR HPV, Vit. D, UA, lipid panel, CMP, CBC.   Harrington ChallengerYOUNG,NANCY J WHNP, 8:52 AM 07/11/2016

## 2016-07-11 NOTE — Patient Instructions (Signed)
gardasil vaccine Health Maintenance, Female Adopting a healthy lifestyle and getting preventive care can go a long way to promote health and wellness. Talk with your health care provider about what schedule of regular examinations is right for you. This is a good chance for you to check in with your provider about disease prevention and staying healthy. In between checkups, there are plenty of things you can do on your own. Experts have done a lot of research about which lifestyle changes and preventive measures are most likely to keep you healthy. Ask your health care provider for more information. WEIGHT AND DIET  Eat a healthy diet  Be sure to include plenty of vegetables, fruits, low-fat dairy products, and lean protein.  Do not eat a lot of foods high in solid fats, added sugars, or salt.  Get regular exercise. This is one of the most important things you can do for your health.  Most adults should exercise for at least 150 minutes each week. The exercise should increase your heart rate and make you sweat (moderate-intensity exercise).  Most adults should also do strengthening exercises at least twice a week. This is in addition to the moderate-intensity exercise.  Maintain a healthy weight  Body mass index (BMI) is a measurement that can be used to identify possible weight problems. It estimates body fat based on height and weight. Your health care provider can help determine your BMI and help you achieve or maintain a healthy weight.  For females 64 years of age and older:   A BMI below 18.5 is considered underweight.  A BMI of 18.5 to 24.9 is normal.  A BMI of 25 to 29.9 is considered overweight.  A BMI of 30 and above is considered obese.  Watch levels of cholesterol and blood lipids  You should start having your blood tested for lipids and cholesterol at 56 years of age, then have this test every 5 years.  You may need to have your cholesterol levels checked more often  if:  Your lipid or cholesterol levels are high.  You are older than 56 years of age.  You are at high risk for heart disease.  CANCER SCREENING   Lung Cancer  Lung cancer screening is recommended for adults 29-52 years old who are at high risk for lung cancer because of a history of smoking.  A yearly low-dose CT scan of the lungs is recommended for people who:  Currently smoke.  Have quit within the past 15 years.  Have at least a 30-pack-year history of smoking. A pack year is smoking an average of one pack of cigarettes a day for 1 year.  Yearly screening should continue until it has been 15 years since you quit.  Yearly screening should stop if you develop a health problem that would prevent you from having lung cancer treatment.  Breast Cancer  Practice breast self-awareness. This means understanding how your breasts normally appear and feel.  It also means doing regular breast self-exams. Let your health care provider know about any changes, no matter how small.  If you are in your 20s or 30s, you should have a clinical breast exam (CBE) by a health care provider every 1-3 years as part of a regular health exam.  If you are 47 or older, have a CBE every year. Also consider having a breast X-ray (mammogram) every year.  If you have a family history of breast cancer, talk to your health care provider about genetic screening.  If you are at high risk for breast cancer, talk to your health care provider about having an MRI and a mammogram every year.  Breast cancer gene (BRCA) assessment is recommended for women who have family members with BRCA-related cancers. BRCA-related cancers include:  Breast.  Ovarian.  Tubal.  Peritoneal cancers.  Results of the assessment will determine the need for genetic counseling and BRCA1 and BRCA2 testing. Cervical Cancer Your health care provider may recommend that you be screened regularly for cancer of the pelvic organs  (ovaries, uterus, and vagina). This screening involves a pelvic examination, including checking for microscopic changes to the surface of your cervix (Pap test). You may be encouraged to have this screening done every 3 years, beginning at age 75.  For women ages 79-65, health care providers may recommend pelvic exams and Pap testing every 3 years, or they may recommend the Pap and pelvic exam, combined with testing for human papilloma virus (HPV), every 5 years. Some types of HPV increase your risk of cervical cancer. Testing for HPV may also be done on women of any age with unclear Pap test results.  Other health care providers may not recommend any screening for nonpregnant women who are considered low risk for pelvic cancer and who do not have symptoms. Ask your health care provider if a screening pelvic exam is right for you.  If you have had past treatment for cervical cancer or a condition that could lead to cancer, you need Pap tests and screening for cancer for at least 20 years after your treatment. If Pap tests have been discontinued, your risk factors (such as having a new sexual partner) need to be reassessed to determine if screening should resume. Some women have medical problems that increase the chance of getting cervical cancer. In these cases, your health care provider may recommend more frequent screening and Pap tests. Colorectal Cancer  This type of cancer can be detected and often prevented.  Routine colorectal cancer screening usually begins at 56 years of age and continues through 56 years of age.  Your health care provider may recommend screening at an earlier age if you have risk factors for colon cancer.  Your health care provider may also recommend using home test kits to check for hidden blood in the stool.  A small camera at the end of a tube can be used to examine your colon directly (sigmoidoscopy or colonoscopy). This is done to check for the earliest forms of  colorectal cancer.  Routine screening usually begins at age 22.  Direct examination of the colon should be repeated every 5-10 years through 56 years of age. However, you may need to be screened more often if early forms of precancerous polyps or small growths are found. Skin Cancer  Check your skin from head to toe regularly.  Tell your health care provider about any new moles or changes in moles, especially if there is a change in a mole's shape or color.  Also tell your health care provider if you have a mole that is larger than the size of a pencil eraser.  Always use sunscreen. Apply sunscreen liberally and repeatedly throughout the day.  Protect yourself by wearing long sleeves, pants, a wide-brimmed hat, and sunglasses whenever you are outside. HEART DISEASE, DIABETES, AND HIGH BLOOD PRESSURE   High blood pressure causes heart disease and increases the risk of stroke. High blood pressure is more likely to develop in:  People who have blood pressure in the  high end of the normal range (130-139/85-89 mm Hg).  People who are overweight or obese.  People who are African American.  If you are 52-57 years of age, have your blood pressure checked every 3-5 years. If you are 2 years of age or older, have your blood pressure checked every year. You should have your blood pressure measured twice--once when you are at a hospital or clinic, and once when you are not at a hospital or clinic. Record the average of the two measurements. To check your blood pressure when you are not at a hospital or clinic, you can use:  An automated blood pressure machine at a pharmacy.  A home blood pressure monitor.  If you are between 68 years and 54 years old, ask your health care provider if you should take aspirin to prevent strokes.  Have regular diabetes screenings. This involves taking a blood sample to check your fasting blood sugar level.  If you are at a normal weight and have a low risk for  diabetes, have this test once every three years after 56 years of age.  If you are overweight and have a high risk for diabetes, consider being tested at a younger age or more often. PREVENTING INFECTION  Hepatitis B  If you have a higher risk for hepatitis B, you should be screened for this virus. You are considered at high risk for hepatitis B if:  You were born in a country where hepatitis B is common. Ask your health care provider which countries are considered high risk.  Your parents were born in a high-risk country, and you have not been immunized against hepatitis B (hepatitis B vaccine).  You have HIV or AIDS.  You use needles to inject street drugs.  You live with someone who has hepatitis B.  You have had sex with someone who has hepatitis B.  You get hemodialysis treatment.  You take certain medicines for conditions, including cancer, organ transplantation, and autoimmune conditions. Hepatitis C  Blood testing is recommended for:  Everyone born from [birth year] through 1966.  Anyone with known risk factors for hepatitis C. Sexually transmitted infections (STIs)  You should be screened for sexually transmitted infections (STIs) including gonorrhea and chlamydia if:  You are sexually active and are younger than 56 years of age.  You are older than 56 years of age and your health care provider tells you that you are at risk for this type of infection.  Your sexual activity has changed since you were last screened and you are at an increased risk for chlamydia or gonorrhea. Ask your health care provider if you are at risk.  If you do not have HIV, but are at risk, it may be recommended that you take a prescription medicine daily to prevent HIV infection. This is called pre-exposure prophylaxis (PrEP). You are considered at risk if:  You are sexually active and do not regularly use condoms or know the HIV status of your partner(s).  You take drugs by injection.  You are  sexually active with a partner who has HIV. Talk with your health care provider about whether you are at high risk of being infected with HIV. If you choose to begin PrEP, you should first be tested for HIV. You should then be tested every 3 months for as long as you are taking PrEP.  PREGNANCY   If you are premenopausal and you may become pregnant, ask your health care provider about preconception counseling.  If  you may become pregnant, take 400 to 800 micrograms (mcg) of folic acid every day.  If you want to prevent pregnancy, talk to your health care provider about birth control (contraception). OSTEOPOROSIS AND MENOPAUSE   Osteoporosis is a disease in which the bones lose minerals and strength with aging. This can result in serious bone fractures. Your risk for osteoporosis can be identified using a bone density scan.  If you are 64 years of age or older, or if you are at risk for osteoporosis and fractures, ask your health care provider if you should be screened.  Ask your health care provider whether you should take a calcium or vitamin D supplement to lower your risk for osteoporosis.  Menopause may have certain physical symptoms and risks.  Hormone replacement therapy may reduce some of these symptoms and risks. Talk to your health care provider about whether hormone replacement therapy is right for you.  HOME CARE INSTRUCTIONS   Schedule regular health, dental, and eye exams.  Stay current with your immunizations.   Do not use any tobacco products including cigarettes, chewing tobacco, or electronic cigarettes.  If you are pregnant, do not drink alcohol.  If you are breastfeeding, limit how much and how often you drink alcohol.  Limit alcohol intake to no more than 1 drink per day for nonpregnant women. One drink equals 12 ounces of beer, 5 ounces of wine, or 1 ounces of hard liquor.  Do not use street drugs.  Do not share needles.  Ask your health care provider for  help if you need support or information about quitting drugs.  Tell your health care provider if you often feel depressed.  Tell your health care provider if you have ever been abused or do not feel safe at home.   This information is not intended to replace advice given to you by your health care provider. Make sure you discuss any questions you have with your health care provider.   Document Released: 04/28/2011 Document Revised: 11/03/2014 Document Reviewed: 09/14/2013 Elsevier Interactive Patient Education [employer].

## 2016-07-12 LAB — URINALYSIS W MICROSCOPIC + REFLEX CULTURE
BACTERIA UA: NONE SEEN [HPF]
BILIRUBIN URINE: NEGATIVE
CRYSTALS: NONE SEEN [HPF]
Casts: NONE SEEN [LPF]
Glucose, UA: NEGATIVE
HGB URINE DIPSTICK: NEGATIVE
Ketones, ur: NEGATIVE
Leukocytes, UA: NEGATIVE
Nitrite: NEGATIVE
PROTEIN: NEGATIVE
RBC / HPF: NONE SEEN RBC/HPF (ref <=2)
Specific Gravity, Urine: 1.018 (ref 1.001–1.035)
Squamous Epithelial / LPF: NONE SEEN [HPF] (ref <=5)
WBC UA: NONE SEEN WBC/HPF (ref <=5)
YEAST: NONE SEEN [HPF]
pH: 8 (ref 5.0–8.0)

## 2016-07-12 LAB — VITAMIN D 25 HYDROXY (VIT D DEFICIENCY, FRACTURES): VIT D 25 HYDROXY: 40 ng/mL (ref 30–100)

## 2016-07-15 LAB — PAP, TP IMAGING W/ HPV RNA, RFLX HPV TYPE 16,18/45: HPV MRNA, HIGH RISK: NOT DETECTED

## 2016-11-25 ENCOUNTER — Other Ambulatory Visit: Payer: Self-pay | Admitting: Women's Health

## 2016-11-25 DIAGNOSIS — Z1231 Encounter for screening mammogram for malignant neoplasm of breast: Secondary | ICD-10-CM

## 2016-12-10 ENCOUNTER — Ambulatory Visit
Admission: RE | Admit: 2016-12-10 | Discharge: 2016-12-10 | Disposition: A | Discharge status: Home / Self Care | Payer: BC Managed Care – PPO | Source: Ambulatory Visit | Attending: Women's Health | Admitting: Women's Health

## 2016-12-10 DIAGNOSIS — Z1231 Encounter for screening mammogram for malignant neoplasm of breast: Secondary | ICD-10-CM

## 2017-03-11 ENCOUNTER — Encounter: Payer: Self-pay | Admitting: Gynecology

## 2017-07-22 ENCOUNTER — Encounter: Payer: Self-pay | Admitting: Women's Health

## 2017-07-22 ENCOUNTER — Ambulatory Visit (INDEPENDENT_AMBULATORY_CARE_PROVIDER_SITE_OTHER): Payer: BC Managed Care – PPO | Admitting: Women's Health

## 2017-07-22 VITALS — BP 128/80 | Ht 65.0 in | Wt 162.0 lb

## 2017-07-22 DIAGNOSIS — Z01419 Encounter for gynecological examination (general) (routine) without abnormal findings: Secondary | ICD-10-CM | POA: Diagnosis not present

## 2017-07-22 DIAGNOSIS — Z13220 Encounter for screening for lipoid disorders: Secondary | ICD-10-CM

## 2017-07-22 LAB — COMPREHENSIVE METABOLIC PANEL
AG RATIO: 1.5 (calc) (ref 1.0–2.5)
ALBUMIN MSPROF: 4.4 g/dL (ref 3.6–5.1)
ALT: 19 U/L (ref 6–29)
AST: 17 U/L (ref 10–35)
Alkaline phosphatase (APISO): 61 U/L (ref 33–130)
BILIRUBIN TOTAL: 0.4 mg/dL (ref 0.2–1.2)
BUN: 14 mg/dL (ref 7–25)
CALCIUM: 9.4 mg/dL (ref 8.6–10.4)
CHLORIDE: 104 mmol/L (ref 98–110)
CO2: 28 mmol/L (ref 20–32)
Creat: 0.65 mg/dL (ref 0.50–1.05)
Globulin: 3 g/dL (calc) (ref 1.9–3.7)
Glucose, Bld: 99 mg/dL (ref 65–99)
POTASSIUM: 4.5 mmol/L (ref 3.5–5.3)
Sodium: 140 mmol/L (ref 135–146)
TOTAL PROTEIN: 7.4 g/dL (ref 6.1–8.1)

## 2017-07-22 LAB — LIPID PANEL
Cholesterol: 247 mg/dL — ABNORMAL HIGH (ref <200)
HDL: 86 mg/dL (ref >50)
LDL Cholesterol (Calc): 140 mg/dL (calc) — ABNORMAL HIGH
Non-HDL Cholesterol (Calc): 161 mg/dL (calc) — ABNORMAL HIGH (ref <130)
Total CHOL/HDL Ratio: 2.9 (calc) (ref <5.0)
Triglycerides: 99 mg/dL (ref <150)

## 2017-07-22 LAB — CBC WITH DIFFERENTIAL/PLATELET
Basophils Absolute: 48 cells/uL (ref 0–200)
Basophils Relative: 1.1 %
Eosinophils Absolute: 167 cells/uL (ref 15–500)
Eosinophils Relative: 3.8 %
HEMATOCRIT: 40.1 % (ref 35.0–45.0)
HEMOGLOBIN: 13.2 g/dL (ref 11.7–15.5)
LYMPHS ABS: 1703 {cells}/uL (ref 850–3900)
MCH: 28 pg (ref 27.0–33.0)
MCHC: 32.9 g/dL (ref 32.0–36.0)
MCV: 85 fL (ref 80.0–100.0)
MPV: 9.9 fL (ref 7.5–12.5)
Monocytes Relative: 8.1 %
NEUTROS ABS: 2125 {cells}/uL (ref 1500–7800)
Neutrophils Relative %: 48.3 %
Platelets: 259 10*3/uL (ref 140–400)
RBC: 4.72 10*6/uL (ref 3.80–5.10)
RDW: 12.2 % (ref 11.0–15.0)
Total Lymphocyte: 38.7 %
WBC mixed population: 356 cells/uL (ref 200–950)
WBC: 4.4 10*3/uL (ref 3.8–10.8)

## 2017-07-22 NOTE — Patient Instructions (Signed)
Health Maintenance for Postmenopausal Women Menopause is a normal process in which your reproductive ability comes to an end. This process happens gradually over a span of months to years, usually between the ages of 22 and 9. Menopause is complete when you have missed 12 consecutive menstrual periods. It is important to talk with your health care provider about some of the most common conditions that affect postmenopausal women, such as heart disease, cancer, and bone loss (osteoporosis). Adopting a healthy lifestyle and getting preventive care can help to promote your health and wellness. Those actions can also lower your chances of developing some of these common conditions. What should I know about menopause? During menopause, you may experience a number of symptoms, such as:  Moderate-to-severe hot flashes.  Night sweats.  Decrease in sex drive.  Mood swings.  Headaches.  Tiredness.  Irritability.  Memory problems.  Insomnia.  Choosing to treat or not to treat menopausal changes is an individual decision that you make with your health care provider. What should I know about hormone replacement therapy and supplements? Hormone therapy products are effective for treating symptoms that are associated with menopause, such as hot flashes and night sweats. Hormone replacement carries certain risks, especially as you become older. If you are thinking about using estrogen or estrogen with progestin treatments, discuss the benefits and risks with your health care provider. What should I know about heart disease and stroke? Heart disease, heart attack, and stroke become more likely as you age. This may be due, in part, to the hormonal changes that your body experiences during menopause. These can affect how your body processes dietary fats, triglycerides, and cholesterol. Heart attack and stroke are both medical emergencies. There are many things that you can do to help prevent heart disease  and stroke:  Have your blood pressure checked at least every 1-2 years. High blood pressure causes heart disease and increases the risk of stroke.  If you are 53-22 years old, ask your health care provider if you should take aspirin to prevent a heart attack or a stroke.  Do not use any tobacco products, including cigarettes, chewing tobacco, or electronic cigarettes. If you need help quitting, ask your health care provider.  It is important to eat a healthy diet and maintain a healthy weight. ? Be sure to include plenty of vegetables, fruits, low-fat dairy products, and lean protein. ? Avoid eating foods that are high in solid fats, added sugars, or salt (sodium).  Get regular exercise. This is one of the most important things that you can do for your health. ? Try to exercise for at least 150 minutes each week. The type of exercise that you do should increase your heart rate and make you sweat. This is known as moderate-intensity exercise. ? Try to do strengthening exercises at least twice each week. Do these in addition to the moderate-intensity exercise.  Know your numbers.Ask your health care provider to check your cholesterol and your blood glucose. Continue to have your blood tested as directed by your health care provider.  What should I know about cancer screening? There are several types of cancer. Take the following steps to reduce your risk and to catch any cancer development as early as possible. Breast Cancer  Practice breast self-awareness. ? This means understanding how your breasts normally appear and feel. ? It also means doing regular breast self-exams. Let your health care provider know about any changes, no matter how small.  If you are 40  or older, have a clinician do a breast exam (clinical breast exam or CBE) every year. Depending on your age, family history, and medical history, it may be recommended that you also have a yearly breast X-ray (mammogram).  If you  have a family history of breast cancer, talk with your health care provider about genetic screening.  If you are at high risk for breast cancer, talk with your health care provider about having an MRI and a mammogram every year.  Breast cancer (BRCA) gene test is recommended for women who have family members with BRCA-related cancers. Results of the assessment will determine the need for genetic counseling and BRCA1 and for BRCA2 testing. BRCA-related cancers include these types: ? Breast. This occurs in males or females. ? Ovarian. ? Tubal. This may also be called fallopian tube cancer. ? Cancer of the abdominal or pelvic lining (peritoneal cancer). ? Prostate. ? Pancreatic.  Cervical, Uterine, and Ovarian Cancer Your health care provider may recommend that you be screened regularly for cancer of the pelvic organs. These include your ovaries, uterus, and vagina. This screening involves a pelvic exam, which includes checking for microscopic changes to the surface of your cervix (Pap test).  For women ages 21-65, health care providers may recommend a pelvic exam and a Pap test every three years. For women ages 79-65, they may recommend the Pap test and pelvic exam, combined with testing for human papilloma virus (HPV), every five years. Some types of HPV increase your risk of cervical cancer. Testing for HPV may also be done on women of any age who have unclear Pap test results.  Other health care providers may not recommend any screening for nonpregnant women who are considered low risk for pelvic cancer and have no symptoms. Ask your health care provider if a screening pelvic exam is right for you.  If you have had past treatment for cervical cancer or a condition that could lead to cancer, you need Pap tests and screening for cancer for at least 20 years after your treatment. If Pap tests have been discontinued for you, your risk factors (such as having a new sexual partner) need to be  reassessed to determine if you should start having screenings again. Some women have medical problems that increase the chance of getting cervical cancer. In these cases, your health care provider may recommend that you have screening and Pap tests more often.  If you have a family history of uterine cancer or ovarian cancer, talk with your health care provider about genetic screening.  If you have vaginal bleeding after reaching menopause, tell your health care provider.  There are currently no reliable tests available to screen for ovarian cancer.  Lung Cancer Lung cancer screening is recommended for adults 69-62 years old who are at high risk for lung cancer because of a history of smoking. A yearly low-dose CT scan of the lungs is recommended if you:  Currently smoke.  Have a history of at least 30 pack-years of smoking and you currently smoke or have quit within the past 15 years. A pack-year is smoking an average of one pack of cigarettes per day for one year.  Yearly screening should:  Continue until it has been 15 years since you quit.  Stop if you develop a health problem that would prevent you from having lung cancer treatment.  Colorectal Cancer  This type of cancer can be detected and can often be prevented.  Routine colorectal cancer screening usually begins at  age 42 and continues through age 45.  If you have risk factors for colon cancer, your health care provider may recommend that you be screened at an earlier age.  If you have a family history of colorectal cancer, talk with your health care provider about genetic screening.  Your health care provider may also recommend using home test kits to check for hidden blood in your stool.  A small camera at the end of a tube can be used to examine your colon directly (sigmoidoscopy or colonoscopy). This is done to check for the earliest forms of colorectal cancer.  Direct examination of the colon should be repeated every  5-10 years until age 71. However, if early forms of precancerous polyps or small growths are found or if you have a family history or genetic risk for colorectal cancer, you may need to be screened more often.  Skin Cancer  Check your skin from head to toe regularly.  Monitor any moles. Be sure to tell your health care provider: ? About any new moles or changes in moles, especially if there is a change in a mole's shape or color. ? If you have a mole that is larger than the size of a pencil eraser.  If any of your family members has a history of skin cancer, especially at a young age, talk with your health care provider about genetic screening.  Always use sunscreen. Apply sunscreen liberally and repeatedly throughout the day.  Whenever you are outside, protect yourself by wearing long sleeves, pants, a wide-brimmed hat, and sunglasses.  What should I know about osteoporosis? Osteoporosis is a condition in which bone destruction happens more quickly than new bone creation. After menopause, you may be at an increased risk for osteoporosis. To help prevent osteoporosis or the bone fractures that can happen because of osteoporosis, the following is recommended:  If you are 46-71 years old, get at least 1,000 mg of calcium and at least 600 mg of vitamin D per day.  If you are older than age 55 but younger than age 65, get at least 1,200 mg of calcium and at least 600 mg of vitamin D per day.  If you are older than age 54, get at least 1,200 mg of calcium and at least 800 mg of vitamin D per day.  Smoking and excessive alcohol intake increase the risk of osteoporosis. Eat foods that are rich in calcium and vitamin D, and do weight-bearing exercises several times each week as directed by your health care provider. What should I know about how menopause affects my mental health? Depression may occur at any age, but it is more common as you become older. Common symptoms of depression  include:  Low or sad mood.  Changes in sleep patterns.  Changes in appetite or eating patterns.  Feeling an overall lack of motivation or enjoyment of activities that you previously enjoyed.  Frequent crying spells.  Talk with your health care provider if you think that you are experiencing depression. What should I know about immunizations? It is important that you get and maintain your immunizations. These include:  Tetanus, diphtheria, and pertussis (Tdap) booster vaccine.  Influenza every year before the flu season begins.  Pneumonia vaccine.  Shingles vaccine.  Your health care provider may also recommend other immunizations. This information is not intended to replace advice given to you by your health care provider. Make sure you discuss any questions you have with your health care provider. Document Released: 12/05/2005  Document Revised: 05/02/2016 Document Reviewed: 07/17/2015 Elsevier Interactive Patient Education  2018 [employer] Carbohydrate Counting for Diabetes Mellitus, Adult Carbohydrate counting is a method for keeping track of how many carbohydrates you eat. Eating carbohydrates naturally increases the amount of sugar (glucose) in the blood. Counting how many carbohydrates you eat helps keep your blood glucose within normal limits, which helps you manage your diabetes (diabetes mellitus). It is important to know how many carbohydrates you can safely have in each meal. This is different for every person. A diet and nutrition specialist (registered dietitian) can help you make a meal plan and calculate how many carbohydrates you should have at each meal and snack. Carbohydrates are found in the following foods:  Grains, such as breads and cereals.  Dried beans and soy products.  Starchy vegetables, such as potatoes, peas, and corn.  Fruit and fruit juices.  Milk and yogurt.  Sweets and snack foods, such as cake, cookies, candy, chips, and soft  drinks.  How do I count carbohydrates? There are two ways to count carbohydrates in food. You can use either of the methods or a combination of both. Reading "Nutrition Facts" on packaged food The "Nutrition Facts" list is included on the labels of almost all packaged foods and beverages in the [place] It includes:  The serving size.  Information about nutrients in each serving, including the grams (g) of carbohydrate per serving.  To use the "Nutrition Facts":  Decide how many servings you will have.  Multiply the number of servings by the number of carbohydrates per serving.  The resulting number is the total amount of carbohydrates that you will be having.  Learning standard serving sizes of other foods When you eat foods containing carbohydrates that are not packaged or do not include "Nutrition Facts" on the label, you need to measure the servings in order to count the amount of carbohydrates:  Measure the foods that you will eat with a food scale or measuring cup, if needed.  Decide how many standard-size servings you will eat.  Multiply the number of servings by 15. Most carbohydrate-rich foods have about 15 g of carbohydrates per serving. ? For example, if you eat 8 oz (170 g) of strawberries, you will have eaten 2 servings and 30 g of carbohydrates (2 servings x 15 g = 30 g).  For foods that have more than one food mixed, such as soups and casseroles, you must count the carbohydrates in each food that is included.  The following list contains standard serving sizes of common carbohydrate-rich foods. Each of these servings has about 15 g of carbohydrates:   hamburger bun or  English muffin.   oz (15 mL) syrup.   oz (14 g) jelly.  1 slice of bread.  1 six-inch tortilla.  3 oz (85 g) cooked rice or pasta.  4 oz (113 g) cooked dried beans.  4 oz (113 g) starchy vegetable, such as peas, corn, or potatoes.  4 oz (113 g) hot cereal.  4 oz (113 g) mashed potatoes  or  of a large baked potato.  4 oz (113 g) canned or frozen fruit.  4 oz (120 mL) fruit juice.  4-6 crackers.  6 chicken nuggets.  6 oz (170 g) unsweetened dry cereal.  6 oz (170 g) plain fat-free yogurt or yogurt sweetened with artificial sweeteners.  8 oz (240 mL) milk.  8 oz (170 g) fresh fruit or one small piece of fruit.  24 oz (680 g) popped   popcorn.  Example of carbohydrate counting Sample meal  3 oz (85 g) chicken breast.  6 oz (170 g) brown rice.  4 oz (113 g) corn.  8 oz (240 mL) milk.  8 oz (170 g) strawberries with sugar-free whipped topping. Carbohydrate calculation 1. Identify the foods that contain carbohydrates: ? Rice. ? Corn. ? Milk. ? Strawberries. 2. Calculate how many servings you have of each food: ? 2 servings rice. ? 1 serving corn. ? 1 serving milk. ? 1 serving strawberries. 3. Multiply each number of servings by 15 g: ? 2 servings rice x 15 g = 30 g. ? 1 serving corn x 15 g = 15 g. ? 1 serving milk x 15 g = 15 g. ? 1 serving strawberries x 15 g = 15 g. 4. Add together all of the amounts to find the total grams of carbohydrates eaten: ? 30 g + 15 g + 15 g + 15 g = 75 g of carbohydrates total. This information is not intended to replace advice given to you by your health care provider. Make sure you discuss any questions you have with your health care provider. Document Released: 10/13/2005 Document Revised: 05/02/2016 Document Reviewed: 03/26/2016 Elsevier Interactive Patient Education  2018 [employer] Inc[employer]  

## 2017-07-22 NOTE — Progress Notes (Signed)
Brenda Hurst Aug 21, 1961 [id number]    History:    Presents for annual exam.  Postmenopausal on no HRT with no bleeding. 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 Normal mammogram history. 2015 normal DEXA. 2016 negative colonoscopy. History of HSV-1 rare outbreaks.  Past medical history, past surgical history, family history and social history were all reviewed and documented in the EPIC chart. Works in a [employer] trader. Brenda Hurst age 56 doing well. Husband truck driver.  ROS:  A ROS was performed and pertinent positives and negatives are included.  Exam:  Vitals:   07/22/17 0832  BP: 128/80  Weight: 162 lb (73.5 kg)  Height:  (1.651 m)   Body mass index is 26.96 kg/m.   General appearance:  Normal Thyroid:  Symmetrical, normal in size, without palpable masses or nodularity. Respiratory  Auscultation:  Clear without wheezing or rhonchi Cardiovascular  Auscultation:  Regular rate, without rubs, murmurs or gallops  Edema/varicosities:  Not grossly evident Abdominal  Soft,nontender, without masses, guarding or rebound.  Liver/spleen:  No organomegaly noted  Hernia:  None appreciated  Skin  Inspection:  Grossly normal   Breasts: Examined lying and sitting.     Right: Without masses, retractions, discharge or axillary adenopathy.     Left: Without masses, retractions, discharge or axillary adenopathy. Gentitourinary   Inguinal/mons:  Normal without inguinal adenopathy  External genitalia:  Normal  BUS/Urethra/Skene's glands:  Normal  Vagina:  Normal  Cervix:  Normal  Uterus:   normal in size, shape and contour.  Midline and mobile  Adnexa/parametria:     Rt: Without masses or tenderness.   Lt: Without masses or tenderness.  Anus and perineum: Normal  Digital rectal exam: Normal sphincter tone without palpated masses or tenderness  Assessment/Plan:  56 y.o. MBF G1 P1  for annual exam with no complaints.  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 Reviewed importance of weightbearing and cardia type exercise. CBC, CMP, lipid panel, Pap. Pap normal with negative HR HPV 2017.    Harrington Challenger [facility], 8:59 AM 07/22/2017

## 2017-07-22 NOTE — Addendum Note (Signed)
Addended by: Kem Parkinson on: 07/22/2017 09:29 AM   Modules accepted: Orders

## 2017-07-23 LAB — PAP IG W/ RFLX HPV ASCU

## 2017-07-24 ENCOUNTER — Other Ambulatory Visit: Payer: Self-pay | Admitting: Women's Health

## 2017-07-24 DIAGNOSIS — E7800 Pure hypercholesterolemia, unspecified: Secondary | ICD-10-CM

## 2017-11-12 ENCOUNTER — Other Ambulatory Visit: Payer: Self-pay | Admitting: Women's Health

## 2017-11-12 DIAGNOSIS — Z1231 Encounter for screening mammogram for malignant neoplasm of breast: Secondary | ICD-10-CM

## 2017-12-11 ENCOUNTER — Ambulatory Visit
Admission: RE | Admit: 2017-12-11 | Discharge: 2017-12-11 | Disposition: A | Discharge status: Home / Self Care | Payer: BC Managed Care – PPO | Source: Ambulatory Visit | Attending: Women's Health | Admitting: Women's Health

## 2017-12-11 DIAGNOSIS — Z1231 Encounter for screening mammogram for malignant neoplasm of breast: Secondary | ICD-10-CM

## 2018-01-05 DIAGNOSIS — S60219A Contusion of unspecified wrist, initial encounter: Secondary | ICD-10-CM | POA: Insufficient documentation

## 2018-01-15 DIAGNOSIS — M25522 Pain in left elbow: Secondary | ICD-10-CM | POA: Insufficient documentation

## 2018-03-25 ENCOUNTER — Ambulatory Visit (INDEPENDENT_AMBULATORY_CARE_PROVIDER_SITE_OTHER): Payer: BC Managed Care – PPO | Admitting: Orthopedic Surgery

## 2018-03-30 ENCOUNTER — Other Ambulatory Visit: Payer: BC Managed Care – PPO

## 2018-03-30 DIAGNOSIS — E7800 Pure hypercholesterolemia, unspecified: Secondary | ICD-10-CM

## 2018-03-30 LAB — LIPID PANEL
CHOL/HDL RATIO: 3.2 (calc) (ref <5.0)
CHOLESTEROL: 196 mg/dL (ref <200)
HDL: 62 mg/dL (ref >50)
LDL CHOLESTEROL (CALC): 114 mg/dL — AB
Non-HDL Cholesterol (Calc): 134 mg/dL (calc) — ABNORMAL HIGH (ref <130)
TRIGLYCERIDES: 95 mg/dL (ref <150)

## 2018-03-31 ENCOUNTER — Ambulatory Visit (INDEPENDENT_AMBULATORY_CARE_PROVIDER_SITE_OTHER): Payer: BC Managed Care – PPO | Admitting: Orthopedic Surgery

## 2018-04-13 DIAGNOSIS — S5002XA Contusion of left elbow, initial encounter: Secondary | ICD-10-CM | POA: Insufficient documentation

## 2018-09-14 ENCOUNTER — Encounter: Payer: Self-pay | Admitting: Women's Health

## 2018-09-14 ENCOUNTER — Ambulatory Visit: Payer: BC Managed Care – PPO | Admitting: Women's Health

## 2018-09-14 VITALS — BP 122/80 | Ht 65.0 in | Wt 165.0 lb

## 2018-09-14 DIAGNOSIS — Z01419 Encounter for gynecological examination (general) (routine) without abnormal findings: Secondary | ICD-10-CM | POA: Diagnosis not present

## 2018-09-14 DIAGNOSIS — Z13220 Encounter for screening for lipoid disorders: Secondary | ICD-10-CM | POA: Diagnosis not present

## 2018-09-14 DIAGNOSIS — Z13820 Encounter for screening for osteoporosis: Secondary | ICD-10-CM | POA: Diagnosis not present

## 2018-09-14 NOTE — Progress Notes (Signed)
Brenda Hurst 07/28/1961 [id number]    History:    Presents for annual exam.  Postmenopausal on no HRT with no bleeding.  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  Normal mammogram history.  HSV 1 rare outbreaks.  GERD, and elevated cholesterol no prescription med.  2015 normal DEXA.  2016- colonoscopy.  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  Past medical history, past surgical history, family history and social history were all reviewed and documented in the EPIC chart.  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  Mother hypertension, deceased.  Sister deceased diabetes and hypertension, brother alive diabetes and hypertension  ROS:  A ROS was performed and pertinent positives and negatives are included.  Exam:  Vitals:   09/14/18 0923  BP: 122/80  Weight: 165 lb (74.8 kg)  Height: 5\' 5"  (1.651 m)   Body mass index is 27.46 kg/m.   General appearance:  Normal Thyroid:  Symmetrical, normal in size, without palpable masses or nodularity. Respiratory  Auscultation:  Clear without wheezing or rhonchi Cardiovascular  Auscultation:  Regular rate, without rubs, murmurs or gallops  Edema/varicosities:  Not grossly evident Abdominal  Soft,nontender, without masses, guarding or rebound.  Liver/spleen:  No organomegaly noted  Hernia:  None appreciated  Skin  Inspection:  Grossly normal   Breasts: Examined lying and sitting.     Right: Without masses, retractions, discharge or axillary adenopathy.     Left: Without masses, retractions, discharge or axillary adenopathy. Gentitourinary   Inguinal/mons:  Normal without inguinal adenopathy  External genitalia:  Normal  BUS/Urethra/Skene's glands:  Normal  Vagina:  Normal  Cervix:  Normal  Uterus:  normal in size, shape and contour.  Midline and mobile  Adnexa/parametria:      Rt: Without masses or tenderness.   Lt: Without masses or tenderness.  Anus and perineum: Normal  Digital rectal exam: Normal sphincter tone without palpated masses or tenderness  Assessment/Plan:  57 y.o. MBF G1, P1 for annual exam with no complaints.  Postmenopausal/no HRT/no bleeding 2012 CIN-1 normal Paps after HSV 1 no outbreaks GERD  Plan: SBE's, continue annual screening mammogram, calcium rich foods, vitamin D 2000 daily and red rice yeast supplement daily encouraged.  Reviewed importance of increasing regular exercise, decreasing calorie/carbs.  Repeat DEXA, will schedule.  Pap normal 2018, new screening guidelines reviewed.    Harrington Challengerancy J  John J. Pershing Va Medical CenterWHNP, 9:39 AM 09/14/2018

## 2018-09-14 NOTE — Patient Instructions (Signed)
Fat and Cholesterol Restricted Diet Getting too much fat and cholesterol in your diet may cause health problems. Following this diet helps keep your fat and cholesterol at normal levels. This can keep you from getting sick. What types of fat should I choose?  Choose monosaturated and polyunsaturated fats. These are found in foods such as olive oil, canola oil, flaxseeds, walnuts, almonds, and seeds.  Eat more omega-3 fats. Good choices include salmon, mackerel, sardines, tuna, flaxseed oil, and ground flaxseeds.  Limit saturated fats. These are in animal products such as meats, butter, and cream. They can also be in plant products such as palm oil, palm kernel oil, and coconut oil.  Avoid foods with partially hydrogenated oils in them. These contain trans fats. Examples of foods that have trans fats are stick margarine, some tub margarines, cookies, crackers, and other baked goods. What general guidelines do I need to follow?  Check food labels. Look for the words "trans fat" and "saturated fat."  When preparing a meal: ? Fill half of your plate with vegetables and green salads. ? Fill one fourth of your plate with whole grains. Look for the word "whole" as the first word in the ingredient list. ? Fill one fourth of your plate with lean protein foods.  Eat more foods that have fiber, like apples, carrots, beans, peas, and barley.  Eat more home-cooked foods. Eat less at restaurants and buffets.  Limit or avoid alcohol.  Limit foods high in starch and sugar.  Limit fried foods.  Cook foods without frying them. Baking, boiling, grilling, and broiling are all great options.  Lose weight if you are overweight. Losing even a small amount of weight can help your overall health. It can also help prevent diseases such as diabetes and heart disease. What foods can I eat? Grains Whole grains, such as whole wheat or whole grain breads, crackers, cereals, and pasta. Unsweetened oatmeal,  bulgur, barley, quinoa, or brown rice. Corn or whole wheat flour tortillas. Vegetables Fresh or frozen vegetables (raw, steamed, roasted, or grilled). Green salads. Fruits All fresh, canned (in natural juice), or frozen fruits. Meat and Other Protein Products Ground beef (85% or leaner), grass-fed beef, or beef trimmed of fat. Skinless chicken or [place]. Ground chicken or [place]. Pork trimmed of fat. All fish and seafood. Eggs. Dried beans, peas, or lentils. Unsalted nuts or seeds. Unsalted canned or dry beans. Dairy Low-fat dairy products, such as skim or 1% milk, 2% or reduced-fat cheeses, low-fat ricotta or cottage cheese, or plain low-fat yogurt. Fats and Oils Tub margarines without trans fats. Light or reduced-fat mayonnaise and salad dressings. Avocado. Olive, canola, sesame, or safflower oils. Natural peanut or almond butter (choose ones without added sugar and oil). The items listed above may not be a complete list of recommended foods or beverages. Contact your dietitian for more options. What foods are not recommended? Grains White bread. White pasta. White rice. Cornbread. Bagels, pastries, and croissants. Crackers that contain trans fat. Vegetables White potatoes. Corn. Creamed or fried vegetables. Vegetables in a cheese sauce. Fruits Dried fruits. Canned fruit in light or heavy syrup. Fruit juice. Meat and Other Protein Products Fatty cuts of meat. Ribs, chicken wings, bacon, sausage, bologna, salami, chitterlings, fatback, hot dogs, bratwurst, and packaged luncheon meats. Liver and organ meats. Dairy Whole or 2% milk, cream, half-and-half, and cream cheese. Whole milk cheeses. Whole-fat or sweetened yogurt. Full-fat cheeses. Nondairy creamers and whipped toppings. Processed cheese, cheese spreads, or cheese curds. Sweets and Desserts Corn  syrup, sugars, honey, and molasses. Candy. Jam and jelly. Syrup. Sweetened cereals. Cookies, pies, cakes, donuts, muffins, and ice  cream. Fats and Oils Butter, stick margarine, lard, shortening, ghee, or bacon fat. Coconut, palm kernel, or palm oils. Beverages Alcohol. Sweetened drinks (such as sodas, lemonade, and fruit drinks or punches). The items listed above may not be a complete list of foods and beverages to avoid. Contact your dietitian for more information. This information is not intended to replace advice given to you by your health care provider. Make sure you discuss any questions you have with your health care provider. Document Released: 04/13/2012 Document Revised: 06/19/2016 Document Reviewed: 01/12/2014 Elsevier Interactive Patient Education  2018 [place] Maintenance for Postmenopausal Women Menopause is a normal process in which your reproductive ability comes to an end. This process happens gradually over a span of months to years, usually between the ages of 81 and 80. Menopause is complete when you have missed 12 consecutive menstrual periods. It is important to talk with your health care provider about some of the most common conditions that affect postmenopausal women, such as heart disease, cancer, and bone loss (osteoporosis). Adopting a healthy lifestyle and getting preventive care can help to promote your health and wellness. Those actions can also lower your chances of developing some of these common conditions. What should I know about menopause? During menopause, you may experience a number of symptoms, such as:  Moderate-to-severe hot flashes.  Night sweats.  Decrease in sex drive.  Mood swings.  Headaches.  Tiredness.  Irritability.  Memory problems.  Insomnia.  Choosing to treat or not to treat menopausal changes is an individual decision that you make with your health care provider. What should I know about hormone replacement therapy and supplements? Hormone therapy products are effective for treating symptoms that are associated with menopause, such as hot  flashes and night sweats. Hormone replacement carries certain risks, especially as you become older. If you are thinking about using estrogen or estrogen with progestin treatments, discuss the benefits and risks with your health care provider. What should I know about heart disease and stroke? Heart disease, heart attack, and stroke become more likely as you age. This may be due, in part, to the hormonal changes that your body experiences during menopause. These can affect how your body processes dietary fats, triglycerides, and cholesterol. Heart attack and stroke are both medical emergencies. There are many things that you can do to help prevent heart disease and stroke:  Have your blood pressure checked at least every 1-2 years. High blood pressure causes heart disease and increases the risk of stroke.  If you are 24-6 years old, ask your health care provider if you should take aspirin to prevent a heart attack or a stroke.  Do not use any tobacco products, including cigarettes, chewing tobacco, or electronic cigarettes. If you need help quitting, ask your health care provider.  It is important to eat a healthy diet and maintain a healthy weight. ? Be sure to include plenty of vegetables, fruits, low-fat dairy products, and lean protein. ? Avoid eating foods that are high in solid fats, added sugars, or salt (sodium).  Get regular exercise. This is one of the most important things that you can do for your health. ? Try to exercise for at least 150 minutes each week. The type of exercise that you do should increase your heart rate and make you sweat. This is known as moderate-intensity exercise. ? Try to  do strengthening exercises at least twice each week. Do these in addition to the moderate-intensity exercise.  Know your numbers.Ask your health care provider to check your cholesterol and your blood glucose. Continue to have your blood tested as directed by your health care provider.  What  should I know about cancer screening? There are several types of cancer. Take the following steps to reduce your risk and to catch any cancer development as early as possible. Breast Cancer  Practice breast self-awareness. ? This means understanding how your breasts normally appear and feel. ? It also means doing regular breast self-exams. Let your health care provider know about any changes, no matter how small.  If you are 33 or older, have a clinician do a breast exam (clinical breast exam or CBE) every year. Depending on your age, family history, and medical history, it may be recommended that you also have a yearly breast X-ray (mammogram).  If you have a family history of breast cancer, talk with your health care provider about genetic screening.  If you are at high risk for breast cancer, talk with your health care provider about having an MRI and a mammogram every year.  Breast cancer (BRCA) gene test is recommended for women who have family members with BRCA-related cancers. Results of the assessment will determine the need for genetic counseling and BRCA1 and for BRCA2 testing. BRCA-related cancers include these types: ? Breast. This occurs in males or females. ? Ovarian. ? Tubal. This may also be called fallopian tube cancer. ? Cancer of the abdominal or pelvic lining (peritoneal cancer). ? Prostate. ? Pancreatic.  Cervical, Uterine, and Ovarian Cancer Your health care provider may recommend that you be screened regularly for cancer of the pelvic organs. These include your ovaries, uterus, and vagina. This screening involves a pelvic exam, which includes checking for microscopic changes to the surface of your cervix (Pap test).  For women ages 21-65, health care providers may recommend a pelvic exam and a Pap test every three years. For women ages 1-65, they may recommend the Pap test and pelvic exam, combined with testing for human papilloma virus (HPV), every five years. Some  types of HPV increase your risk of cervical cancer. Testing for HPV may also be done on women of any age who have unclear Pap test results.  Other health care providers may not recommend any screening for nonpregnant women who are considered low risk for pelvic cancer and have no symptoms. Ask your health care provider if a screening pelvic exam is right for you.  If you have had past treatment for cervical cancer or a condition that could lead to cancer, you need Pap tests and screening for cancer for at least 20 years after your treatment. If Pap tests have been discontinued for you, your risk factors (such as having a new sexual partner) need to be reassessed to determine if you should start having screenings again. Some women have medical problems that increase the chance of getting cervical cancer. In these cases, your health care provider may recommend that you have screening and Pap tests more often.  If you have a family history of uterine cancer or ovarian cancer, talk with your health care provider about genetic screening.  If you have vaginal bleeding after reaching menopause, tell your health care provider.  There are currently no reliable tests available to screen for ovarian cancer.  Lung Cancer Lung cancer screening is recommended for adults 62-4 years old who are at high  risk for lung cancer because of a history of smoking. A yearly low-dose CT scan of the lungs is recommended if you:  Currently smoke.  Have a history of at least 30 pack-years of smoking and you currently smoke or have quit within the past 15 years. A pack-year is smoking an average of one pack of cigarettes per day for one year.  Yearly screening should:  Continue until it has been 15 years since you quit.  Stop if you develop a health problem that would prevent you from having lung cancer treatment.  Colorectal Cancer  This type of cancer can be detected and can often be prevented.  Routine colorectal  cancer screening usually begins at age 55 and continues through age 82.  If you have risk factors for colon cancer, your health care provider may recommend that you be screened at an earlier age.  If you have a family history of colorectal cancer, talk with your health care provider about genetic screening.  Your health care provider may also recommend using home test kits to check for hidden blood in your stool.  A small camera at the end of a tube can be used to examine your colon directly (sigmoidoscopy or colonoscopy). This is done to check for the earliest forms of colorectal cancer.  Direct examination of the colon should be repeated every 5-10 years until age 54. However, if early forms of precancerous polyps or small growths are found or if you have a family history or genetic risk for colorectal cancer, you may need to be screened more often.  Skin Cancer  Check your skin from head to toe regularly.  Monitor any moles. Be sure to tell your health care provider: ? About any new moles or changes in moles, especially if there is a change in a mole's shape or color. ? If you have a mole that is larger than the size of a pencil eraser.  If any of your family members has a history of skin cancer, especially at a Marti Mclane age, talk with your health care provider about genetic screening.  Always use sunscreen. Apply sunscreen liberally and repeatedly throughout the day.  Whenever you are outside, protect yourself by wearing long sleeves, pants, a wide-brimmed hat, and sunglasses.  What should I know about osteoporosis? Osteoporosis is a condition in which bone destruction happens more quickly than new bone creation. After menopause, you may be at an increased risk for osteoporosis. To help prevent osteoporosis or the bone fractures that can happen because of osteoporosis, the following is recommended:  If you are 56-76 years old, get at least 1,000 mg of calcium and at least 600 mg of  vitamin D per day.  If you are older than age 72 but younger than age 66, get at least 1,200 mg of calcium and at least 600 mg of vitamin D per day.  If you are older than age 23, get at least 1,200 mg of calcium and at least 800 mg of vitamin D per day.  Smoking and excessive alcohol intake increase the risk of osteoporosis. Eat foods that are rich in calcium and vitamin D, and do weight-bearing exercises several times each week as directed by your health care provider. What should I know about how menopause affects my mental health? Depression may occur at any age, but it is more common as you become older. Common symptoms of depression include:  Low or sad mood.  Changes in sleep patterns.  Changes in appetite  or eating patterns.  Feeling an overall lack of motivation or enjoyment of activities that you previously enjoyed.  Frequent crying spells.  Talk with your health care provider if you think that you are experiencing depression. What should I know about immunizations? It is important that you get and maintain your immunizations. These include:  Tetanus, diphtheria, and pertussis (Tdap) booster vaccine.  Influenza every year before the flu season begins.  Pneumonia vaccine.  Shingles vaccine.  Your health care provider may also recommend other immunizations. This information is not intended to replace advice given to you by your health care provider. Make sure you discuss any questions you have with your health care provider. Document Released: 12/05/2005 Document Revised: 05/02/2016 Document Reviewed: 07/17/2015 Elsevier Interactive Patient Education  2018 [employer].

## 2018-09-15 LAB — COMPREHENSIVE METABOLIC PANEL
AG RATIO: 1.6 (calc) (ref 1.0–2.5)
ALT: 14 U/L (ref 6–29)
AST: 17 U/L (ref 10–35)
Albumin: 4.5 g/dL (ref 3.6–5.1)
Alkaline phosphatase (APISO): 61 U/L (ref 33–130)
BUN: 11 mg/dL (ref 7–25)
CO2: 27 mmol/L (ref 20–32)
Calcium: 9.8 mg/dL (ref 8.6–10.4)
Chloride: 103 mmol/L (ref 98–110)
Creat: 0.69 mg/dL (ref 0.50–1.05)
GLUCOSE: 92 mg/dL (ref 65–99)
Globulin: 2.9 g/dL (calc) (ref 1.9–3.7)
Potassium: 4 mmol/L (ref 3.5–5.3)
Sodium: 140 mmol/L (ref 135–146)
Total Bilirubin: 0.3 mg/dL (ref 0.2–1.2)
Total Protein: 7.4 g/dL (ref 6.1–8.1)

## 2018-09-15 LAB — CBC WITH DIFFERENTIAL/PLATELET
BASOS ABS: 31 {cells}/uL (ref 0–200)
BASOS PCT: 0.7 %
EOS ABS: 101 {cells}/uL (ref 15–500)
Eosinophils Relative: 2.3 %
HCT: 41.3 % (ref 35.0–45.0)
Hemoglobin: 13.6 g/dL (ref 11.7–15.5)
Lymphs Abs: 1368 cells/uL (ref 850–3900)
MCH: 27.8 pg (ref 27.0–33.0)
MCHC: 32.9 g/dL (ref 32.0–36.0)
MCV: 84.5 fL (ref 80.0–100.0)
MONOS PCT: 8 %
MPV: 10.3 fL (ref 7.5–12.5)
Neutro Abs: 2548 cells/uL (ref 1500–7800)
Neutrophils Relative %: 57.9 %
PLATELETS: 257 10*3/uL (ref 140–400)
RBC: 4.89 10*6/uL (ref 3.80–5.10)
RDW: 12.4 % (ref 11.0–15.0)
TOTAL LYMPHOCYTE: 31.1 %
WBC: 4.4 10*3/uL (ref 3.8–10.8)
WBCMIX: 352 {cells}/uL (ref 200–950)

## 2018-09-15 LAB — LIPID PANEL
Cholesterol: 245 mg/dL — ABNORMAL HIGH (ref <200)
HDL: 76 mg/dL (ref >50)
LDL Cholesterol (Calc): 140 mg/dL (calc) — ABNORMAL HIGH
NON-HDL CHOLESTEROL (CALC): 169 mg/dL — AB (ref <130)
TRIGLYCERIDES: 156 mg/dL — AB (ref <150)
Total CHOL/HDL Ratio: 3.2 (calc) (ref <5.0)

## 2018-09-21 IMAGING — MG DIGITAL SCREENING BILATERAL MAMMOGRAM WITH TOMO AND CAD
8 series · 8 of 24 positions shown · non-contrast
Comparison: Previous exam(s).

ACR Breast Density Category a: The breast tissue is almost entirely
fatty.

CLINICAL DATA: Screening.

EXAM:
DIGITAL SCREENING BILATERAL MAMMOGRAM WITH TOMO AND CAD

[R CC synth-2D]
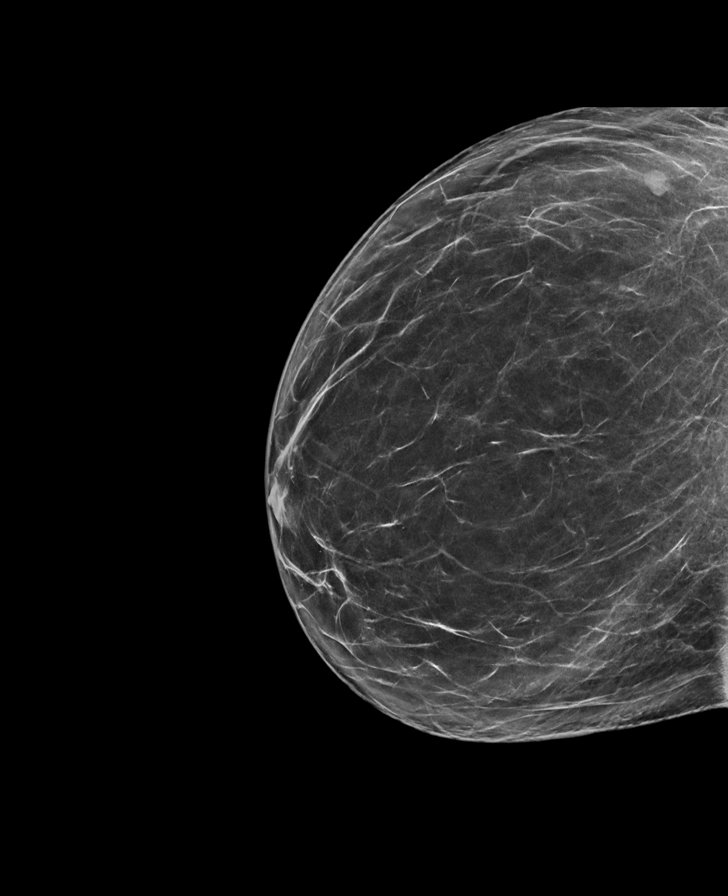

[L CC synth-2D]
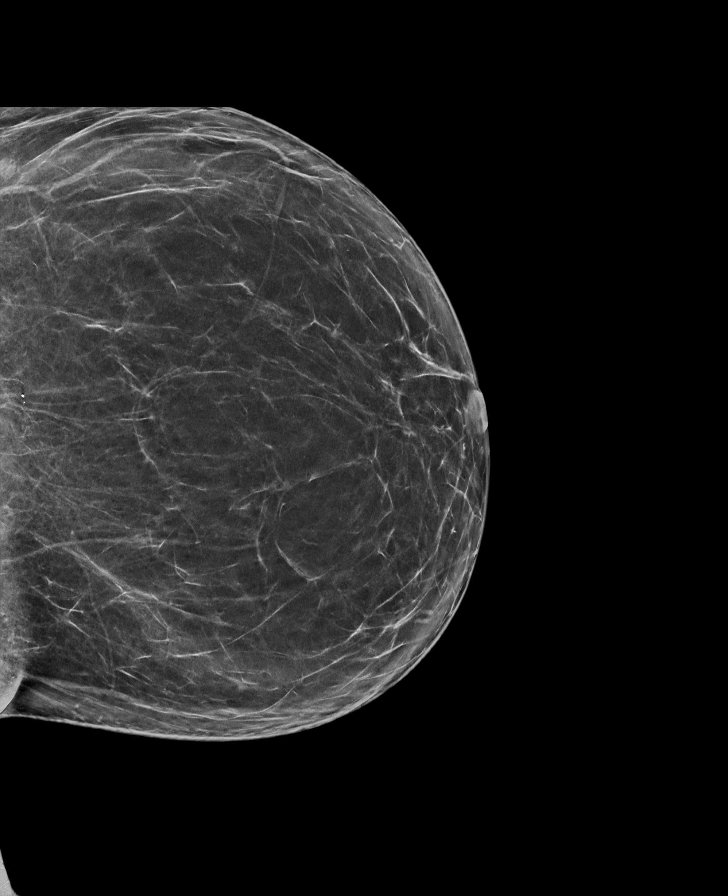

[R MLO synth-2D]
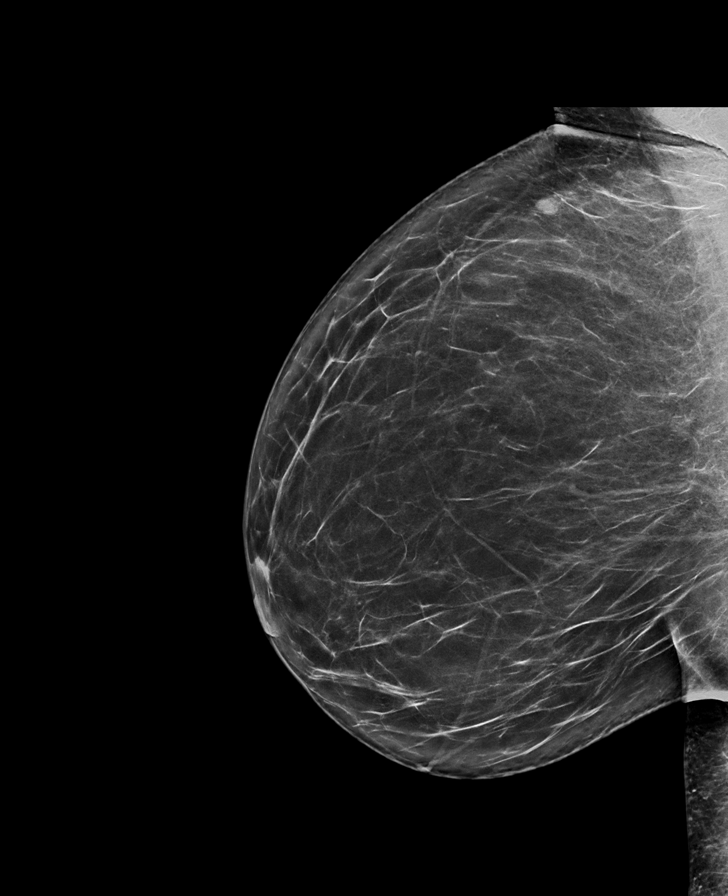

[L MLO synth-2D]
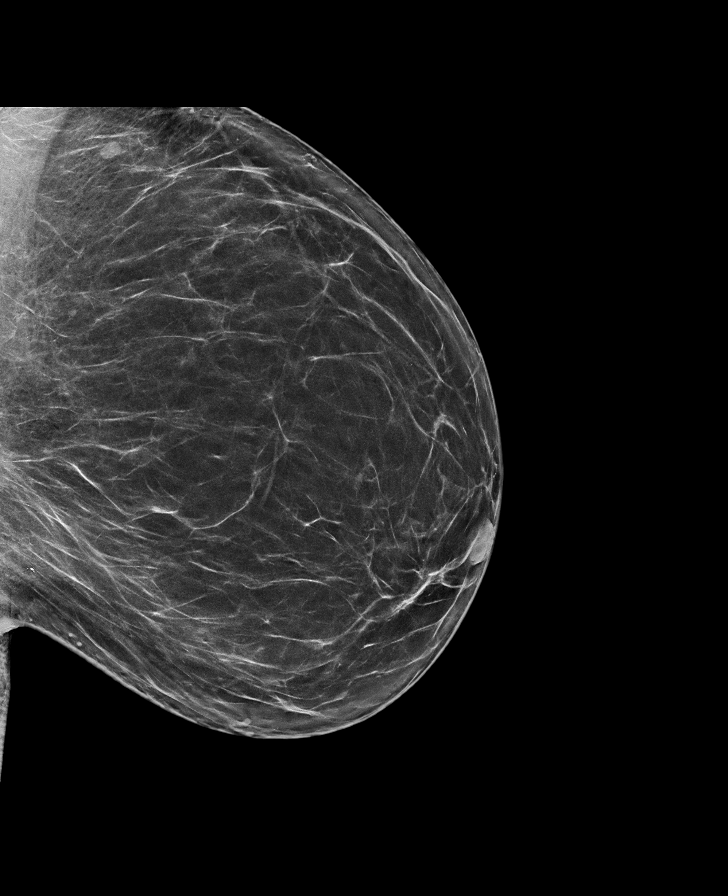

[R CC tomo · tomo slice 39/77.0]
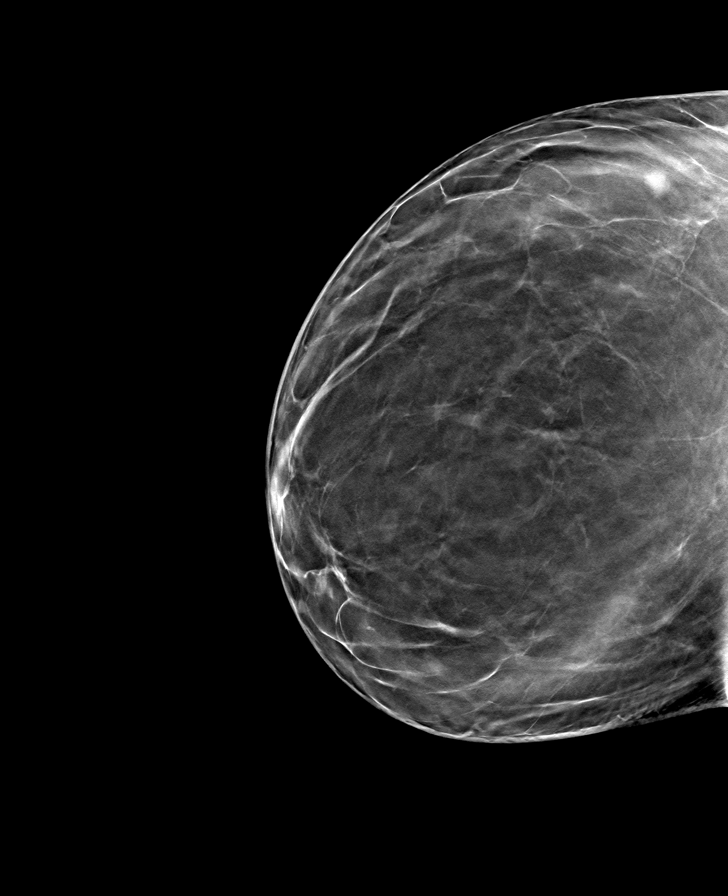

[L MLO tomo · tomo slice 43/84.0]
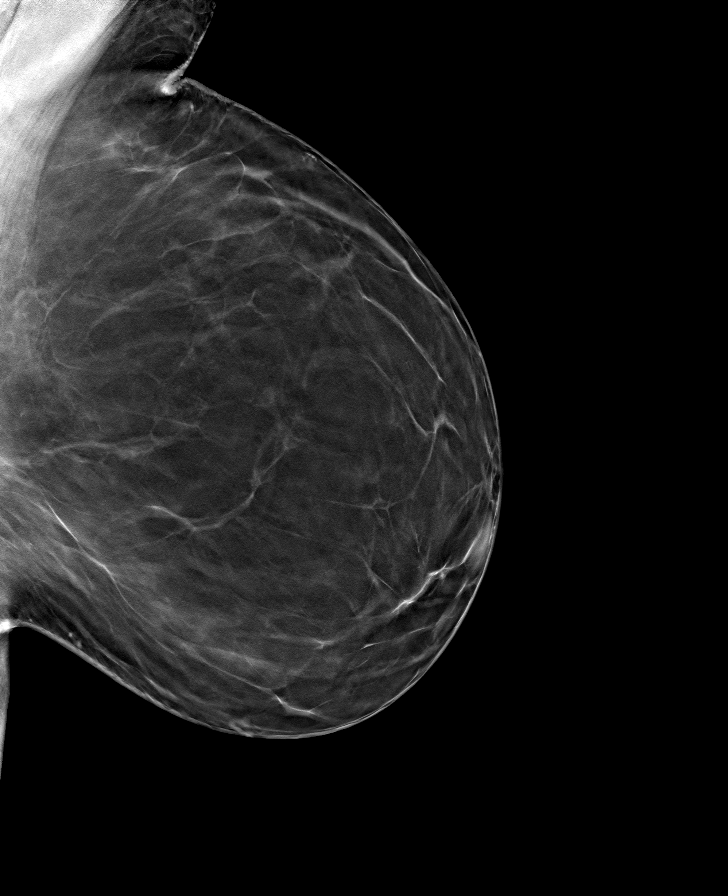

[R MLO tomo · tomo slice 43/85.0]
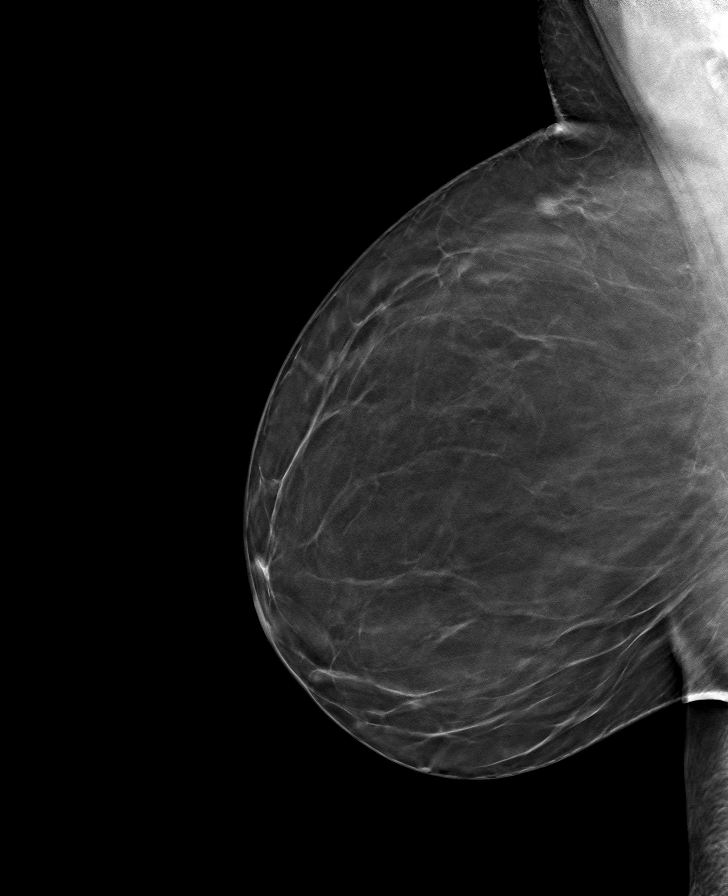

[L CC tomo · tomo slice 39/76.0]
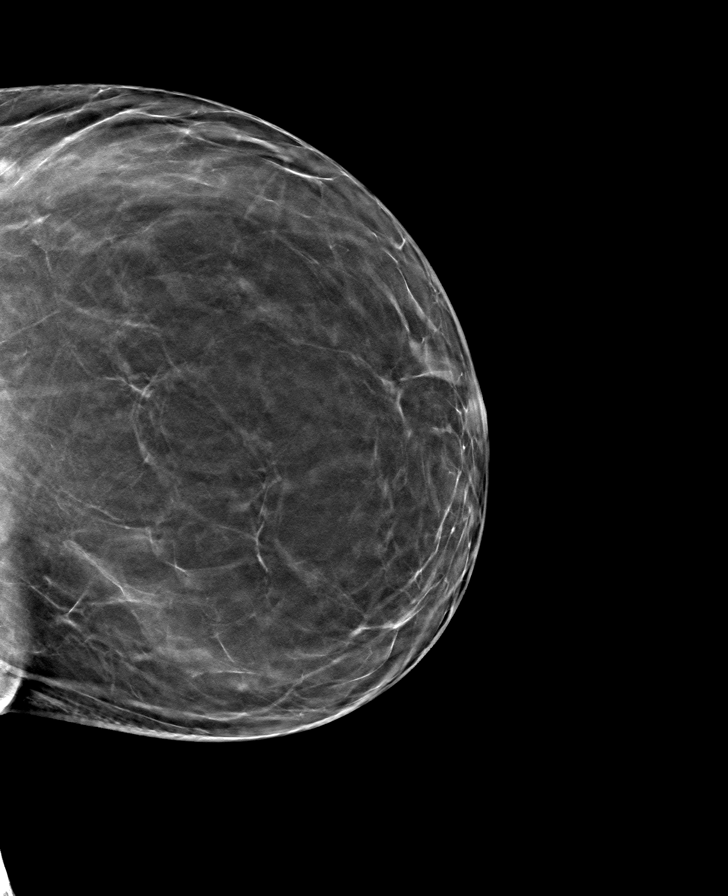

[8 of 24 positions shown; findings below may reference images not displayed]

FINDINGS: There are no findings suspicious for malignancy. Images were
processed with CAD.
IMPRESSION: No mammographic evidence of malignancy. A result letter of this
screening mammogram will be mailed directly to the patient.

RECOMMENDATION:
Screening mammogram in one year. (Code:[id number])

BI-RADS CATEGORY  1: Negative.

## 2018-10-13 ENCOUNTER — Encounter: Payer: Self-pay | Admitting: Gynecology

## 2018-10-13 ENCOUNTER — Ambulatory Visit (INDEPENDENT_AMBULATORY_CARE_PROVIDER_SITE_OTHER): Payer: BC Managed Care – PPO

## 2018-10-13 DIAGNOSIS — Z13820 Encounter for screening for osteoporosis: Secondary | ICD-10-CM | POA: Diagnosis not present

## 2018-10-14 ENCOUNTER — Other Ambulatory Visit: Payer: Self-pay | Admitting: Gynecology

## 2018-10-14 DIAGNOSIS — Z13820 Encounter for screening for osteoporosis: Secondary | ICD-10-CM

## 2018-11-15 ENCOUNTER — Other Ambulatory Visit: Payer: Self-pay | Admitting: Women's Health

## 2018-11-15 DIAGNOSIS — Z1231 Encounter for screening mammogram for malignant neoplasm of breast: Secondary | ICD-10-CM

## 2018-12-15 ENCOUNTER — Ambulatory Visit
Admission: RE | Admit: 2018-12-15 | Discharge: 2018-12-15 | Disposition: A | Discharge status: Home / Self Care | Payer: BC Managed Care – PPO | Source: Ambulatory Visit | Attending: Women's Health | Admitting: Women's Health

## 2018-12-15 DIAGNOSIS — Z1231 Encounter for screening mammogram for malignant neoplasm of breast: Secondary | ICD-10-CM

## 2019-07-26 ENCOUNTER — Encounter: Payer: Self-pay | Admitting: Gynecology

## 2019-09-20 ENCOUNTER — Encounter: Payer: Self-pay | Admitting: Women's Health

## 2019-09-20 ENCOUNTER — Ambulatory Visit: Payer: BC Managed Care – PPO | Admitting: Women's Health

## 2019-09-20 ENCOUNTER — Other Ambulatory Visit: Payer: Self-pay

## 2019-09-20 VITALS — BP 120/80 | Ht 65.0 in | Wt 159.6 lb

## 2019-09-20 DIAGNOSIS — Z13820 Encounter for screening for osteoporosis: Secondary | ICD-10-CM

## 2019-09-20 DIAGNOSIS — Z13220 Encounter for screening for lipoid disorders: Secondary | ICD-10-CM

## 2019-09-20 DIAGNOSIS — Z01419 Encounter for gynecological examination (general) (routine) without abnormal findings: Secondary | ICD-10-CM | POA: Diagnosis not present

## 2019-09-20 NOTE — Patient Instructions (Signed)
Good to see you today Schedule dexa  Health Maintenance for Postmenopausal Women Menopause is a normal process in which your ability to get pregnant comes to an end. This process happens slowly over many months or years, usually between the ages of 42 and 40. Menopause is complete when you have missed your menstrual periods for 12 months. It is important to talk with your health care provider about some of the most common conditions that affect women after menopause (postmenopausal women). These include heart disease, cancer, and bone loss (osteoporosis). Adopting a healthy lifestyle and getting preventive care can help to promote your health and wellness. The actions you take can also lower your chances of developing some of these common conditions. What should I know about menopause? During menopause, you may get a number of symptoms, such as:  Hot flashes. These can be moderate or severe.  Night sweats.  Decrease in sex drive.  Mood swings.  Headaches.  Tiredness.  Irritability.  Memory problems.  Insomnia. Choosing to treat or not to treat these symptoms is a decision that you make with your health care provider. Do I need hormone replacement therapy?  Hormone replacement therapy is effective in treating symptoms that are caused by menopause, such as hot flashes and night sweats.  Hormone replacement carries certain risks, especially as you become older. If you are thinking about using estrogen or estrogen with progestin, discuss the benefits and risks with your health care provider. What is my risk for heart disease and stroke? The risk of heart disease, heart attack, and stroke increases as you age. One of the causes may be a change in the body's hormones during menopause. This can affect how your body uses dietary fats, triglycerides, and cholesterol. Heart attack and stroke are medical emergencies. There are many things that you can do to help prevent heart disease and  stroke. Watch your blood pressure  High blood pressure causes heart disease and increases the risk of stroke. This is more likely to develop in people who have high blood pressure readings, are of African descent, or are overweight.  Have your blood pressure checked: ? Every 3-5 years if you are 29-61 years of age. ? Every year if you are 29 years old or older. Eat a healthy diet   Eat a diet that includes plenty of vegetables, fruits, low-fat dairy products, and lean protein.  Do not eat a lot of foods that are high in solid fats, added sugars, or sodium. Get regular exercise Get regular exercise. This is one of the most important things you can do for your health. Most adults should:  Try to exercise for at least 150 minutes each week. The exercise should increase your heart rate and make you sweat (moderate-intensity exercise).  Try to do strengthening exercises at least twice each week. Do these in addition to the moderate-intensity exercise.  Spend less time sitting. Even light physical activity can be beneficial. Other tips  Work with your health care provider to achieve or maintain a healthy weight.  Do not use any products that contain nicotine or tobacco, such as cigarettes, e-cigarettes, and chewing tobacco. If you need help quitting, ask your health care provider.  Know your numbers. Ask your health care provider to check your cholesterol and your blood sugar (glucose). Continue to have your blood tested as directed by your health care provider. Do I need screening for cancer? Depending on your health history and family history, you may need to have cancer  screening at different stages of your life. This may include screening for:  Breast cancer.  Cervical cancer.  Lung cancer.  Colorectal cancer. What is my risk for osteoporosis? After menopause, you may be at increased risk for osteoporosis. Osteoporosis is a condition in which bone destruction happens more  quickly than new bone creation. To help prevent osteoporosis or the bone fractures that can happen because of osteoporosis, you may take the following actions:  If you are 38-74 years old, get at least 1,000 mg of calcium and at least 600 mg of vitamin D per day.  If you are older than age 28 but younger than age 68, get at least 1,200 mg of calcium and at least 600 mg of vitamin D per day.  If you are older than age 37, get at least 1,200 mg of calcium and at least 800 mg of vitamin D per day. Smoking and drinking excessive alcohol increase the risk of osteoporosis. Eat foods that are rich in calcium and vitamin D, and do weight-bearing exercises several times each week as directed by your health care provider. How does menopause affect my mental health? Depression may occur at any age, but it is more common as you become older. Common symptoms of depression include:  Low or sad mood.  Changes in sleep patterns.  Changes in appetite or eating patterns.  Feeling an overall lack of motivation or enjoyment of activities that you previously enjoyed.  Frequent crying spells. Talk with your health care provider if you think that you are experiencing depression. General instructions See your health care provider for regular wellness exams and vaccines. This may include:  Scheduling regular health, dental, and eye exams.  Getting and maintaining your vaccines. These include: ? Influenza vaccine. Get this vaccine each year before the flu season begins. ? Pneumonia vaccine. ? Shingles vaccine. ? Tetanus, diphtheria, and pertussis (Tdap) booster vaccine. Your health care provider may also recommend other immunizations. Tell your health care provider if you have ever been abused or do not feel safe at home. Summary  Menopause is a normal process in which your ability to get pregnant comes to an end.  This condition causes hot flashes, night sweats, decreased interest in sex, mood swings,  headaches, or lack of sleep.  Treatment for this condition may include hormone replacement therapy.  Take actions to keep yourself healthy, including exercising regularly, eating a healthy diet, watching your weight, and checking your blood pressure and blood sugar levels.  Get screened for cancer and depression. Make sure that you are up to date with all your vaccines. This information is not intended to replace advice given to you by your health care provider. Make sure you discuss any questions you have with your health care provider. Document Released: 12/05/2005 Document Revised: 10/06/2018 Document Reviewed: 10/06/2018 Elsevier Patient Education  2020 [employer].

## 2019-09-20 NOTE — Progress Notes (Signed)
Brenda Hurst Mar 12, 1961 [id number]  History: 58 y.o. G1P1 MBF presents for annual exam with no complaints. Postmenopausal, no HT, no bleeding. 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 Normal mammogram history, 2016 colonoscopy negative, 09/2018 normal DEXA. History of GERD, elevated cholesterol not managed by prescription medication, HSV1, rare outbreaks. Reports 6 lb weight loss with diet and exercise changes.   Past medical history, past surgical history, family history and social history were all reviewed and documented in the EPIC chart. Works in [employer]. Son Mitzi Hansen in college, studying accounting. Mother deceased HTN, father deceased liver disease.   ROS:  A ROS was performed and pertinent positives and negatives are included.  Exam:  Vitals:   09/20/19 1447  BP: 120/80  Weight: 159 lb 9.6 oz (72.4 kg)  Height: 5\' 5"  (1.651 m)   Body mass index is 26.56 kg/m.   General appearance:  Normal Thyroid:  Symmetrical, normal in size, without palpable masses or nodularity. Respiratory  Auscultation:  Clear without wheezing or rhonchi Cardiovascular  Auscultation:  Regular rate, without rubs, murmurs or gallops  Edema/varicosities:  Not grossly evident Abdominal  Soft,nontender, without masses, guarding or rebound.  Liver/spleen:  No organomegaly noted  Hernia:  None appreciated  Skin  Inspection:  Grossly normal   Breasts: Examined lying and sitting. Pendulous    Right: Without masses, retractions, discharge or axillary adenopathy.     Left: Without masses, retractions, discharge or axillary adenopathy. Gentitourinary   Inguinal/mons:  Normal without inguinal adenopathy  External genitalia:  Normal  BUS/Urethra/Skene's glands:  Normal  Vagina:  Normal  Cervix:  Normal  Uterus:  Normal in size, shape and contour.  Midline and mobile  Adnexa/parametria:     Rt: Without masses or tenderness.   Lt: Without masses or tenderness.  Anus and perineum: Normal  Digital rectal  exam: Normal sphincter tone without palpated masses or tenderness  Assessment/Plan:  58 y.o.G1P1 MBF for annual exam with no complaints.   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 Reviewed importance of continuing daily walking, exercise and healthy diet.  Congratulated on weight loss with diet and exercise.  Normal DEXA 2019, repeat in 5 years.  Declined flu shot. Pap with HR HPV typing, CBC w/ diff, CMP, lipid panel ordered. New Pap guidelines reviewed.     Huel Cote [facility], 3:09 PM 09/20/2019

## 2019-09-21 LAB — LIPID PANEL
Cholesterol: 192 mg/dL (ref <200)
HDL: 77 mg/dL (ref >=50)
LDL Cholesterol (Calc): 99 mg/dL (calc)
Non-HDL Cholesterol (Calc): 115 mg/dL (calc) (ref <130)
Total CHOL/HDL Ratio: 2.5 (calc) (ref <5.0)
Triglycerides: 73 mg/dL (ref <150)

## 2019-09-21 LAB — CBC WITH DIFFERENTIAL/PLATELET
Absolute Monocytes: 363 cells/uL (ref 200–950)
Basophils Absolute: 29 cells/uL (ref 0–200)
Basophils Relative: 0.6 %
Eosinophils Absolute: 113 cells/uL (ref 15–500)
Eosinophils Relative: 2.3 %
HCT: 41.5 % (ref 35.0–45.0)
Hemoglobin: 13.6 g/dL (ref 11.7–15.5)
Lymphs Abs: 1867 cells/uL (ref 850–3900)
MCH: 28.4 pg (ref 27.0–33.0)
MCHC: 32.8 g/dL (ref 32.0–36.0)
MCV: 86.6 fL (ref 80.0–100.0)
MPV: 10.4 fL (ref 7.5–12.5)
Monocytes Relative: 7.4 %
Neutro Abs: 2528 cells/uL (ref 1500–7800)
Neutrophils Relative %: 51.6 %
Platelets: 283 10*3/uL (ref 140–400)
RBC: 4.79 10*6/uL (ref 3.80–5.10)
RDW: 12.2 % (ref 11.0–15.0)
Total Lymphocyte: 38.1 %
WBC: 4.9 10*3/uL (ref 3.8–10.8)

## 2019-09-21 LAB — COMPREHENSIVE METABOLIC PANEL
AG Ratio: 1.5 (calc) (ref 1.0–2.5)
ALT: 13 U/L (ref 6–29)
AST: 15 U/L (ref 10–35)
Albumin: 4.6 g/dL (ref 3.6–5.1)
Alkaline phosphatase (APISO): 57 U/L (ref 37–153)
BUN: 13 mg/dL (ref 7–25)
CO2: 28 mmol/L (ref 20–32)
Calcium: 10 mg/dL (ref 8.6–10.4)
Chloride: 101 mmol/L (ref 98–110)
Creat: 0.65 mg/dL (ref 0.50–1.05)
Globulin: 3 g/dL (calc) (ref 1.9–3.7)
Glucose, Bld: 90 mg/dL (ref 65–99)
Potassium: 4.1 mmol/L (ref 3.5–5.3)
Sodium: 139 mmol/L (ref 135–146)
Total Bilirubin: 0.4 mg/dL (ref 0.2–1.2)
Total Protein: 7.6 g/dL (ref 6.1–8.1)

## 2019-09-26 LAB — PAP, TP IMAGING W/ HPV RNA, RFLX HPV TYPE 16,18/45: HPV DNA High Risk: NOT DETECTED

## 2019-10-13 ENCOUNTER — Other Ambulatory Visit: Payer: Self-pay

## 2019-10-13 ENCOUNTER — Ambulatory Visit
Admission: EM | Admit: 2019-10-13 | Discharge: 2019-10-13 | Disposition: A | Discharge status: Home / Self Care | Payer: BC Managed Care – PPO | Attending: Physician Assistant | Admitting: Physician Assistant

## 2019-10-13 ENCOUNTER — Encounter: Payer: Self-pay | Admitting: Emergency Medicine

## 2019-10-13 DIAGNOSIS — Z20822 Contact with and (suspected) exposure to covid-19: Secondary | ICD-10-CM

## 2019-10-13 DIAGNOSIS — Z20828 Contact with and (suspected) exposure to other viral communicable diseases: Secondary | ICD-10-CM

## 2019-10-13 NOTE — Discharge Instructions (Signed)
COVID testing ordered. As discussed, given recent exposure without symptoms, you may still be in incubation period. Monitor for any symptoms such as cough, congestion, shortness of breath, loss of taste/smell, fever, to start self quarantine and may need retesting. Go to the emergency department for further evaluation if you develop significant shortness of breath, cannot speak in full sentences.  

## 2019-10-13 NOTE — ED Notes (Signed)
Patient able to ambulate independently  

## 2019-10-13 NOTE — ED Triage Notes (Signed)
Pt presents to [facility] for assessment after an exposure at work, patient unsure of who or when.  States her job is requiring testing.

## 2019-10-13 NOTE — ED Provider Notes (Signed)
[facility]    CSN: [id number] Arrival date & time: 10/13/19  0900      History   Chief Complaint Chief Complaint  Patient presents with  . Exposure to COVID    HPI Brenda Hurst is a 58 y.o. female.   58 year old female comes in for COVID testing after possible exposure. She is unsure when the exposure was and who it is. 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 A coworker in the front room serving food tested positive for COVID. States she does not know which coworker, but has not been within 47ft of coworkers working in the front room. 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  Patient remains asymptomatic. Denies URI symptoms such as cough, congestion, sore throat. Denies fever, chills, body aches. Denies abdominal pain, nausea, vomiting, diarrhea. Denies shortness of breath, loss of taste/smell. No antipyretics in the last 8 hours.     Past Medical History:  Diagnosis Date  . GERD (gastroesophageal reflux disease)   . HSV-1 infection     Patient Active Problem List   Diagnosis Date Noted  . Cervical dysplasia 06/26/2011    Past Surgical History:  Procedure Laterality Date  . KNEE SURGERY  12/2009   LIGAMENT    OB History    Gravida  1   Para  1   Term      Preterm      AB      Living  1     SAB      TAB      Ectopic      Multiple      Live Births               Home Medications    Prior to Admission medications   Medication Sig Start Date End Date Taking? Authorizing Provider  B Complex-C (SUPER B COMPLEX PO) Take 1 capsule by mouth daily.    [provider]  Biotin 1 MG CAPS Take 1 capsule by mouth daily.    [provider]  calcium carbonate (OS-CAL) 600 MG TABS tablet Take 600 mg by mouth 2 (two) times daily with a meal.    [provider]  fish oil-omega-3 fatty acids 1000 MG capsule Take 2 g by mouth daily.     [provider]  Multiple Vitamin (MULTIVITAMIN) tablet Take 1 tablet by mouth daily.    [provider]  Naproxen Sodium (ALEVE) 220 MG CAPS Take 2 capsules by mouth as needed.    [provider]  OVER THE COUNTER MEDICATION Take 2 capsules by mouth daily. Hair, Skin and Nails    [provider]  Red Yeast Rice Extract (RED YEAST RICE PO) Take 1 tablet by mouth daily.    [provider]    Family History Family History  Problem Relation Age of Onset  . Hypertension Mother   . Diabetes Sister   . Hypertension Sister   . Diabetes Brother   . Hypertension Brother   . Colon cancer Neg Hx   . Colon polyps Neg Hx   . Rectal cancer Neg Hx   . Stomach cancer Neg Hx     Social History Social History   Tobacco Use  . Smoking status: Never Smoker  . Smokeless tobacco: Never Used  Substance Use Topics  . Alcohol use: No  . Drug use: No  Allergies   Diclofenac sodium and Ibuprofen   Review of Systems Review of Systems  Reason unable to perform ROS: See HPI as above.     Physical Exam Triage Vital Signs ED Triage Vitals  Enc Vitals Group     BP 10/13/19 0912 (!) 152/83     Pulse Rate 10/13/19 0912 69     Resp 10/13/19 0912 16     Temp 10/13/19 0912 97.6 F (36.4 C)     Temp Source 10/13/19 0912 Temporal     SpO2 10/13/19 0912 98 %     Weight --      Height --      Head Circumference --      Peak Flow --      Pain Score 10/13/19 0914 0     Pain Loc --      Pain Edu? --      Excl. in [place]? --    No data found.  Updated Vital Signs BP (!) 152/83 (BP Location: Left Arm)   Pulse 70   Temp (!) 97.4 F (36.3 C) (Temporal)   Resp 16   LMP 12/08/2011   SpO2 98%   Physical Exam Constitutional:      General: She is not in acute distress.    Appearance: Normal appearance. She is not ill-appearing, toxic-appearing or diaphoretic.  HENT:     Head: Normocephalic and atraumatic.     Mouth/Throat:     Mouth: Mucous  membranes are moist.     Pharynx: Oropharynx is clear. Uvula midline.  Cardiovascular:     Rate and Rhythm: Normal rate and regular rhythm.     Heart sounds: Normal heart sounds. No murmur. No friction rub. No gallop.   Pulmonary:     Effort: Pulmonary effort is normal. No accessory muscle usage, prolonged expiration, respiratory distress or retractions.     Comments: Lungs clear to auscultation without adventitious lung sounds. Musculoskeletal:     Cervical back: Normal range of motion and neck supple.  Neurological:     General: No focal deficit present.     Mental Status: She is alert and oriented to person, place, and time.      UC Treatments / Results  Labs (all labs ordered are listed, but only abnormal results are displayed) Labs Reviewed  NOVEL CORONAVIRUS, NAA    EKG   Radiology No results found.  Procedures Procedures (including critical care time)  Medications Ordered in UC Medications - No data to display  Initial Impression / Assessment and Plan / UC Course  I have reviewed the triage vital signs and the nursing notes.  Pertinent labs & imaging results that were available during my care of the patient were reviewed by me and considered in my medical decision making (see chart for details).    Discussed with patient, given positive exposure without symptoms, could still be within incubation period. If develop symptoms, may need retesting.  Patient expresses understanding and would like to proceed with testing.  COVID testing ordered.  Patient will continue to monitor symptoms.  To self quarantine if develop any symptoms.  Return precautions given.  Final Clinical Impressions(s) / UC Diagnoses   Final diagnoses:  Exposure to COVID-19 virus   ED Prescriptions    None     PDMP not reviewed this encounter.   Ok Edwards, PA-C 10/13/19 1015

## 2019-10-14 LAB — NOVEL CORONAVIRUS, NAA: SARS-CoV-2, NAA: NOT DETECTED

## 2019-11-17 ENCOUNTER — Other Ambulatory Visit: Payer: Self-pay | Admitting: Women's Health

## 2019-11-17 DIAGNOSIS — Z1231 Encounter for screening mammogram for malignant neoplasm of breast: Secondary | ICD-10-CM

## 2019-12-20 ENCOUNTER — Other Ambulatory Visit: Payer: Self-pay

## 2019-12-20 ENCOUNTER — Ambulatory Visit
Admission: RE | Admit: 2019-12-20 | Discharge: 2019-12-20 | Disposition: A | Discharge status: Home / Self Care | Payer: BC Managed Care – PPO | Source: Ambulatory Visit | Attending: Women's Health | Admitting: Women's Health

## 2019-12-20 DIAGNOSIS — Z1231 Encounter for screening mammogram for malignant neoplasm of breast: Secondary | ICD-10-CM

## 2020-05-25 ENCOUNTER — Ambulatory Visit: Payer: Self-pay

## 2020-05-25 ENCOUNTER — Ambulatory Visit: Payer: BC Managed Care – PPO | Admitting: Orthopedic Surgery

## 2020-05-25 VITALS — Ht 65.5 in | Wt 162.0 lb

## 2020-05-25 DIAGNOSIS — M25562 Pain in left knee: Secondary | ICD-10-CM

## 2020-05-27 ENCOUNTER — Encounter: Payer: Self-pay | Admitting: Orthopedic Surgery

## 2020-05-27 NOTE — Progress Notes (Signed)
Office Visit Note   Patient: Brenda Hurst           Date of Birth: 1961/10/07           MRN: [id number] Visit Date: 05/25/2020 Requested by: Harrington Challenger, NP No address on file PCP: Harrington Challenger, NP  Subjective: Chief Complaint  Patient presents with  . Left Knee - Pain    HPI: Brenda Hurst is a patient with left knee pain.  She is had prior arthroscopy and partial lateral meniscectomy done years ago.  She is had pain now for about 6 months.  She reports some swelling.  Also some weakness and giving way.  Uses a brace at times.  The pain does not wake her from sleep at night.  She works in [employer] on her feet for about 7 hours but has good shoes.              ROS: All systems reviewed are negative as they relate to the chief complaint within the history of present illness.  Patient denies  fevers or chills.   Assessment & Plan: Visit Diagnoses:  1. Left knee pain, unspecified chronicity     Plan: Impression is mildly progressive right knee lateral compartment arthritis.  I think this is something that has not progressed as much as I thought it might.  She had essentially complete lateral meniscectomy done years ago.  No effusion today.  In general we talked about episodic injections in the need eventually for knee replacement.  She wants to hold off on intervention for now.  Mostly she was wondering about how our treatment plan differed from flex agenic's and we discussed that at length as well.  We will see her back as needed if she wants to proceed with any alternating cortisone and gel injections.  Also encouraged her to do some nonweightbearing quad strengthening exercises which could also help with some of the overall pain she is having.  Follow-Up Instructions: Return if symptoms worsen or fail to improve.   Orders:  Orders Placed This Encounter  Procedures  . XR KNEE 3 VIEW LEFT   No orders of the defined types were placed in this encounter.     Procedures: No  procedures performed   Clinical Data: No additional findings.  Objective: Vital Signs: Ht 5' 5.5" (1.664 m)   Wt 162 lb (73.5 kg)   LMP 12/08/2011   BMI 26.55 kg/m   Physical Exam:   Constitutional: Patient appears well-developed HEENT:  Head: Normocephalic Eyes:EOM are normal Neck: Normal range of motion Cardiovascular: Normal rate Pulmonary/chest: Effort normal Neurologic: Patient is alert Skin: Skin is warm Psychiatric: Patient has normal mood and affect    Ortho Exam: Ortho exam demonstrates full active and passive range of motion of the right knee.  Left knee has no effusion.  Slight valgus alignment.  Lateral greater than medial joint line tenderness but stable collateral cruciate ligaments.  Pedal pulses palpable.  No masses lymphadenopathy or skin changes noted in that left knee region.  No groin pain with internal extra rotation of the leg.  Range of motion is from 0-1 20.  No flexion contracture yet.  Specialty Comments:  No specialty comments available.  Imaging: No results found.   PMFS History: Patient Active Problem List   Diagnosis Date Noted  . Cervical dysplasia 06/26/2011   Past Medical History:  Diagnosis Date  . GERD (gastroesophageal reflux disease)   . HSV-1 infection     Family  History  Problem Relation Age of Onset  . Hypertension Mother   . Diabetes Sister   . Hypertension Sister   . Diabetes Brother   . Hypertension Brother   . Colon cancer Neg Hx   . Colon polyps Neg Hx   . Rectal cancer Neg Hx   . Stomach cancer Neg Hx     Past Surgical History:  Procedure Laterality Date  . KNEE SURGERY  12/2009   LIGAMENT   Social History   Occupational History  . Not on file  Tobacco Use  . Smoking status: Never Smoker  . Smokeless tobacco: Never Used  Vaping Use  . Vaping Use: Never used  Substance and Sexual Activity  . Alcohol use: No  . Drug use: No  . Sexual activity: Yes    Birth control/protection: None    Comment:  intercourse age 59, less than 5 sexual partnrers,des neg

## 2020-09-27 ENCOUNTER — Encounter: Payer: Self-pay | Admitting: Nurse Practitioner

## 2020-09-27 ENCOUNTER — Ambulatory Visit (INDEPENDENT_AMBULATORY_CARE_PROVIDER_SITE_OTHER): Payer: BC Managed Care – PPO | Admitting: Nurse Practitioner

## 2020-09-27 ENCOUNTER — Other Ambulatory Visit: Payer: Self-pay

## 2020-09-27 VITALS — BP 122/80 | Ht 65.0 in | Wt 162.0 lb

## 2020-09-27 DIAGNOSIS — Z01419 Encounter for gynecological examination (general) (routine) without abnormal findings: Secondary | ICD-10-CM | POA: Diagnosis not present

## 2020-09-27 NOTE — Progress Notes (Signed)
   Brenda Hurst [birth year] [id number]   History:  59 y.o. G 1P1 presents for annual exam without GYN complaints. Postmenopausal - no HRT, no bleeding. 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 Normal mammogram history. Not currently sexually active.   Gynecologic History Patient's last menstrual period was 12/08/2011.   Contraception: post menopausal status Last Pap: 08/2019. Results were: normal Last mammogram: 11/2019. Results were: normal Last colonoscopy: 2016. Results were: normal Last Dexa: 09/2018. Results were: normal  Past medical history, past surgical history, family history and social history were all reviewed and documented in the EPIC chart.  ROS:  A ROS was performed and pertinent positives and negatives are included.  Exam:  Vitals:   09/27/20 1459  BP: 122/80  Weight: 162 lb (73.5 kg)  Height: 5\' 5"  (1.651 m)   Body mass index is 26.96 kg/m.  General appearance:  Normal Thyroid:  Symmetrical, normal in size, without palpable masses or nodularity. Respiratory  Auscultation:  Clear without wheezing or rhonchi Cardiovascular  Auscultation:  Regular rate, without rubs, murmurs or gallops  Edema/varicosities:  Not grossly evident Abdominal  Soft,nontender, without masses, guarding or rebound.  Liver/spleen:  No organomegaly noted  Hernia:  None appreciated  Skin  Inspection:  Grossly normal    Breasts: Examined lying and sitting.   Right: Without masses, retractions, discharge or axillary adenopathy.   Left: Without masses, retractions, discharge or axillary adenopathy. Gentitourinary   Inguinal/mons:  Normal without inguinal adenopathy  External genitalia:  Normal  BUS/Urethra/Skene's glands:  Normal  Vagina:  Normal  Cervix:  Normal  Uterus:  Normal in size, shape and contour.  Midline and mobile  Adnexa/parametria:     Rt: Without masses or tenderness.   Lt: Without masses or tenderness.  Anus and perineum: Normal  Digital rectal exam: Normal  sphincter tone without palpated masses or tenderness  Assessment/Plan:  59 y.o. G1P1 for annual exam.   Well female exam with routine gynecological exam - Plan: CBC with Differential/Platelet, Comprehensive metabolic panel. Education provided on SBEs, importance of preventative screenings, current guidelines, high calcium diet, regular exercise, and multivitamin daily.   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  Will repeat Pap next year.  Screening for breast cancer -normal mammogram history.  Continue annual screenings.  Normal breast exam today.  Screening for colon cancer - Will repeat screening colonoscopy at 10-year interval per GI recommendation.  Follow-up in 1 year for annual.     [address] [facility], 3:08 PM 09/27/2020

## 2020-09-27 NOTE — Patient Instructions (Signed)

## 2020-09-28 LAB — CBC WITH DIFFERENTIAL/PLATELET
Absolute Monocytes: 500 cells/uL (ref 200–950)
Basophils Absolute: 29 cells/uL (ref 0–200)
Basophils Relative: 0.6 %
Eosinophils Absolute: 147 cells/uL (ref 15–500)
Eosinophils Relative: 3 %
HCT: 39.9 % (ref 35.0–45.0)
Hemoglobin: 12.8 g/dL (ref 11.7–15.5)
Lymphs Abs: 1622 cells/uL (ref 850–3900)
MCH: 27.6 pg (ref 27.0–33.0)
MCHC: 32.1 g/dL (ref 32.0–36.0)
MCV: 86.2 fL (ref 80.0–100.0)
MPV: 10.4 fL (ref 7.5–12.5)
Monocytes Relative: 10.2 %
Neutro Abs: 2602 cells/uL (ref 1500–7800)
Neutrophils Relative %: 53.1 %
Platelets: 245 10*3/uL (ref 140–400)
RBC: 4.63 10*6/uL (ref 3.80–5.10)
RDW: 12.4 % (ref 11.0–15.0)
Total Lymphocyte: 33.1 %
WBC: 4.9 10*3/uL (ref 3.8–10.8)

## 2020-09-28 LAB — COMPREHENSIVE METABOLIC PANEL
AG Ratio: 1.6 (calc) (ref 1.0–2.5)
ALT: 17 U/L (ref 6–29)
AST: 18 U/L (ref 10–35)
Albumin: 4.3 g/dL (ref 3.6–5.1)
Alkaline phosphatase (APISO): 62 U/L (ref 37–153)
BUN: 20 mg/dL (ref 7–25)
CO2: 26 mmol/L (ref 20–32)
Calcium: 9.9 mg/dL (ref 8.6–10.4)
Chloride: 103 mmol/L (ref 98–110)
Creat: 0.71 mg/dL (ref 0.50–0.99)
Globulin: 2.7 g/dL (calc) (ref 1.9–3.7)
Glucose, Bld: 75 mg/dL (ref 65–99)
Potassium: 4.5 mmol/L (ref 3.5–5.3)
Sodium: 140 mmol/L (ref 135–146)
Total Bilirubin: 0.3 mg/dL (ref 0.2–1.2)
Total Protein: 7 g/dL (ref 6.1–8.1)

## 2020-11-20 ENCOUNTER — Other Ambulatory Visit: Payer: Self-pay | Admitting: Nurse Practitioner

## 2020-11-20 DIAGNOSIS — Z1231 Encounter for screening mammogram for malignant neoplasm of breast: Secondary | ICD-10-CM

## 2021-01-03 ENCOUNTER — Ambulatory Visit: Payer: BC Managed Care – PPO

## 2021-02-25 ENCOUNTER — Ambulatory Visit
Admission: RE | Admit: 2021-02-25 | Discharge: 2021-02-25 | Disposition: A | Discharge status: Home / Self Care | Payer: BC Managed Care – PPO | Source: Ambulatory Visit | Attending: Nurse Practitioner | Admitting: Nurse Practitioner

## 2021-02-25 ENCOUNTER — Other Ambulatory Visit: Payer: Self-pay

## 2021-02-25 DIAGNOSIS — Z1231 Encounter for screening mammogram for malignant neoplasm of breast: Secondary | ICD-10-CM

## 2021-06-24 ENCOUNTER — Ambulatory Visit: Payer: BC Managed Care – PPO | Admitting: Orthopedic Surgery

## 2021-09-13 ENCOUNTER — Ambulatory Visit (INDEPENDENT_AMBULATORY_CARE_PROVIDER_SITE_OTHER): Payer: BC Managed Care – PPO | Admitting: Surgical

## 2021-09-13 ENCOUNTER — Ambulatory Visit: Payer: Self-pay

## 2021-09-13 ENCOUNTER — Other Ambulatory Visit: Payer: Self-pay

## 2021-09-13 DIAGNOSIS — M175 Other unilateral secondary osteoarthritis of knee: Secondary | ICD-10-CM | POA: Diagnosis not present

## 2021-09-13 DIAGNOSIS — M25562 Pain in left knee: Secondary | ICD-10-CM

## 2021-09-13 MED ORDER — IBUPROFEN 800 MG PO TABS: 800.0000 mg | ORAL_TABLET | Freq: Three times a day (TID) | ORAL | 0 refills | Status: AC | PRN

## 2021-09-15 ENCOUNTER — Encounter: Payer: Self-pay | Admitting: Surgical

## 2021-09-15 NOTE — Progress Notes (Signed)
Office Visit Note   Patient: Brenda Hurst           Date of Birth: 1961-04-15           MRN: [id number] Visit Date: 09/13/2021 Requested by: No referring provider defined for this encounter. PCP: Patient, No Pcp Per (Inactive)  Subjective: Chief Complaint  Patient presents with   Left Knee - Pain    HPI: Brenda Hurst is a 60 y.o. female who presents to the office complaining of left knee pain.  Patient complains of chronic left knee pain that has been worsening over the last year.  She complains of severe pain in the left knee that she localizes diffusely throughout the knee with some worse pain in the lateral aspect of the knee.  Pain radiates down her shin.  Denies any groin pain, radicular pain, low back pain.  She has history of prior lateral meniscal debridement that was essentially a near-total meniscectomy based on how much of the meniscus was torn and had to be debrided at the time by Dr. Diamantina Providence report.  Pain is not waking her up at this time..  No new injury              ROS: All systems reviewed are negative as they relate to the chief complaint within the history of present illness.  Patient denies fevers or chills.  Assessment & Plan: Visit Diagnoses:  1. Other secondary osteoarthritis of left knee     Plan: Patient is a 60 year old female who presents for evaluation of left knee pain.  She has history of lateral meniscal debridement that was near total meniscectomy by Dr. Diamantina Providence report.  This was several years ago and now she returns complaining of severe pain in the left knee.  She has had no recent treatment or injections.  She has left knee radiographs today demonstrating severe arthritis of the left knee primarily in the lateral compartment with progression since previous radiographs.  After discussion of options, patient would like to hold off on any injection today.  She would like to try anti-inflammatory medication so ibuprofen was prescribed.  Also plan for her to  go to physical therapy for 1 session to design home exercise program to focus on nonloadbearing quadricep strengthening exercises as well as stationary bike for cardio.  Left knee hinged knee brace was provided for today.  Follow-up as needed if symptoms do not improve within 6 to 8 weeks and consider cortisone injection in the future.  Patient agreed with plan.  Follow-Up Instructions: No follow-ups on file.   Orders:  Orders Placed This Encounter  Procedures   XR Knee 1-2 Views Left   Ambulatory referral to Physical Therapy   Meds ordered this encounter  Medications   ibuprofen (ADVIL) 800 MG tablet    Sig: Take 1 tablet (800 mg total) by mouth every 8 (eight) hours as needed.    Dispense:  30 tablet    Refill:  0      Procedures: No procedures performed   Clinical Data: No additional findings.  Objective: Vital Signs: LMP 12/08/2011   Physical Exam:  Constitutional: Patient appears well-developed HEENT:  Head: Normocephalic Eyes:EOM are normal Neck: Normal range of motion Cardiovascular: Normal rate Pulmonary/chest: Effort normal Neurologic: Patient is alert Skin: Skin is warm Psychiatric: Patient has normal mood and affect  Ortho Exam: Ortho exam demonstrates left knee with 3 degrees extension and 120 degrees of knee flexion.  Valgus deformity is present and partially correctable.  Palpable pedal pulses.  No pain with hip range of motion.  Tenderness over the medial and lateral joint lines, primarily over the lateral joint line.  Mild effusion present.  Able to perform straight leg raise without difficulty.  Specialty Comments:  No specialty comments available.  Imaging: No results found.   PMFS History: Patient Active Problem List   Diagnosis Date Noted   Cervical dysplasia 06/26/2011   Past Medical History:  Diagnosis Date   GERD (gastroesophageal reflux disease)    HSV-1 infection     Family History  Problem Relation Age of Onset   Hypertension  Mother    Diabetes Sister    Hypertension Sister    Diabetes Brother    Hypertension Brother    Colon cancer Neg Hx    Colon polyps Neg Hx    Rectal cancer Neg Hx    Stomach cancer Neg Hx     Past Surgical History:  Procedure Laterality Date   KNEE SURGERY  12/2009   LIGAMENT   Social History   Occupational History   Not on file  Tobacco Use   Smoking status: Never   Smokeless tobacco: Never  Vaping Use   Vaping Use: Never used  Substance and Sexual Activity   Alcohol use: No   Drug use: No   Sexual activity: Yes    Birth control/protection: None    Comment: intercourse age 60, less than 5 sexual partnrers,des neg

## 2021-10-04 ENCOUNTER — Ambulatory Visit: Payer: BC Managed Care – PPO | Admitting: Surgical

## 2021-10-10 ENCOUNTER — Other Ambulatory Visit: Payer: Self-pay | Admitting: Nurse Practitioner

## 2021-10-10 ENCOUNTER — Ambulatory Visit (INDEPENDENT_AMBULATORY_CARE_PROVIDER_SITE_OTHER): Payer: BC Managed Care – PPO | Admitting: Nurse Practitioner

## 2021-10-10 ENCOUNTER — Other Ambulatory Visit: Payer: Self-pay

## 2021-10-10 ENCOUNTER — Encounter: Payer: Self-pay | Admitting: Nurse Practitioner

## 2021-10-10 VITALS — BP 110/74 | HR 66 | Resp 12 | Ht 64.5 in | Wt 152.0 lb

## 2021-10-10 DIAGNOSIS — Z01419 Encounter for gynecological examination (general) (routine) without abnormal findings: Secondary | ICD-10-CM | POA: Diagnosis not present

## 2021-10-10 DIAGNOSIS — Z833 Family history of diabetes mellitus: Secondary | ICD-10-CM | POA: Diagnosis not present

## 2021-10-10 DIAGNOSIS — Z780 Asymptomatic menopausal state: Secondary | ICD-10-CM | POA: Diagnosis not present

## 2021-10-10 NOTE — Progress Notes (Addendum)
° °  Brenda Hurst 1961/10/13 [id number]   History:  60 y.o. G 1P1 presents for annual exam without GYN complaints. Postmenopausal - no HRT, no bleeding. 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 Normal mammogram history.   Gynecologic History Patient's last menstrual period was 12/08/2011.   Contraception: post menopausal status Sexually active: No  Health maintenance Last Pap: 09/20/2019. Results were: Normal, 5-year repeat Last mammogram: 02/25/2021. Results were: Normal Last colonoscopy: 05/09/2015. Results were: Normal, 10-year recall Last Dexa: 10/13/2018. Results were: Normal  Past medical history, past surgical history, family history and social history were all reviewed and documented in the EPIC chart. Works in Development worker, community at [employer]. 61 yo son, Cabin crew at [employer].   ROS:  A ROS was performed and pertinent positives and negatives are included.  Exam:  Vitals:   10/10/21 1520  BP: 110/74  Pulse: 66  Resp: 12  Weight: 152 lb (68.9 kg)  Height: 5' 4.5" (1.638 m)   Body mass index is 25.69 kg/m.  General appearance:  Normal Thyroid:  Symmetrical, normal in size, without palpable masses or nodularity. Respiratory  Auscultation:  Clear without wheezing or rhonchi Cardiovascular  Auscultation:  Regular rate, without rubs, murmurs or gallops  Edema/varicosities:  Not grossly evident Abdominal  Soft,nontender, without masses, guarding or rebound.  Liver/spleen:  No organomegaly noted  Hernia:  None appreciated  Skin  Inspection:  Grossly normal    Breasts: Examined lying and sitting.   Right: Without masses, retractions, discharge or axillary adenopathy.   Left: Without masses, retractions, discharge or axillary adenopathy. Genitourinary   Inguinal/mons:  Normal without inguinal adenopathy  External genitalia:  Normal appearing vulva with no masses, tenderness, or lesions  BUS/Urethra/Skene's glands:  Normal  Vagina:  Normal appearing with normal color and  discharge, no lesions. Atrophic changes  Cervix:  Normal appearing without discharge or lesions  Uterus:  Normal in size, shape and contour.  Midline and mobile, nontender  Adnexa/parametria:     Rt: Normal in size, without masses or tenderness.   Lt: Normal in size, without masses or tenderness.  Anus and perineum: Normal  Digital rectal exam: Normal sphincter tone without palpated masses or tenderness  Patient informed chaperone available to be present for breast and pelvic exam. Patient has requested no chaperone to be present. Patient has been advised what will be completed during breast and pelvic exam.   Assessment/Plan:  60 y.o. G1P1 for annual exam.   Well female exam with routine gynecological exam - Plan: Comprehensive metabolic panel, CBC with Differential/Platelet, Lipid panel. Education provided on SBEs, importance of preventative screenings, current guidelines, high calcium diet, regular exercise, and multivitamin daily.   Postmenopausal - on HRT, no bleeding.   Family history of diabetes mellitus - Plan: Hemoglobin A1c. Brother and sister with diabetes.   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  Will repeat at 5-year interval per guidelines.   Screening for breast cancer - Normal mammogram history.  Continue annual screenings.  Normal breast exam today.  Screening for colon cancer - 2016 colonoscopy. Will repeat at 10-year interval per GI recommendation.  Screening for osteoporosis - Normal bone density 2019. Will repeat DXA at age 60.   Follow-up in 1 year for annual.     Olivia Mackie [facility], 3:34 PM 10/10/2021

## 2021-10-10 NOTE — Addendum Note (Signed)
Addended byWyline Beady on: 10/10/2021 03:54 PM   Modules accepted: Orders

## 2021-10-11 ENCOUNTER — Other Ambulatory Visit: Payer: BC Managed Care – PPO

## 2021-10-15 ENCOUNTER — Other Ambulatory Visit: Payer: BC Managed Care – PPO

## 2021-10-15 ENCOUNTER — Other Ambulatory Visit: Payer: Self-pay

## 2021-10-15 DIAGNOSIS — Z833 Family history of diabetes mellitus: Secondary | ICD-10-CM

## 2021-10-15 DIAGNOSIS — Z01419 Encounter for gynecological examination (general) (routine) without abnormal findings: Secondary | ICD-10-CM

## 2021-10-16 ENCOUNTER — Other Ambulatory Visit: Payer: Self-pay | Admitting: Nurse Practitioner

## 2021-10-16 DIAGNOSIS — R7303 Prediabetes: Secondary | ICD-10-CM

## 2021-10-16 LAB — COMPREHENSIVE METABOLIC PANEL
AG Ratio: 1.6 (calc) (ref 1.0–2.5)
ALT: 22 U/L (ref 6–29)
AST: 22 U/L (ref 10–35)
Albumin: 4.4 g/dL (ref 3.6–5.1)
Alkaline phosphatase (APISO): 65 U/L (ref 37–153)
BUN: 11 mg/dL (ref 7–25)
CO2: 30 mmol/L (ref 20–32)
Calcium: 9.3 mg/dL (ref 8.6–10.4)
Chloride: 102 mmol/L (ref 98–110)
Creat: 0.67 mg/dL (ref 0.50–1.05)
Globulin: 2.8 g/dL (calc) (ref 1.9–3.7)
Glucose, Bld: 100 mg/dL — ABNORMAL HIGH (ref 65–99)
Potassium: 5 mmol/L (ref 3.5–5.3)
Sodium: 138 mmol/L (ref 135–146)
Total Bilirubin: 0.4 mg/dL (ref 0.2–1.2)
Total Protein: 7.2 g/dL (ref 6.1–8.1)

## 2021-10-16 LAB — CBC WITH DIFFERENTIAL/PLATELET
Absolute Monocytes: 663 cells/uL (ref 200–950)
Basophils Absolute: 19 cells/uL (ref 0–200)
Basophils Relative: 0.6 %
Eosinophils Absolute: 19 cells/uL (ref 15–500)
Eosinophils Relative: 0.6 %
HCT: 40.1 % (ref 35.0–45.0)
Hemoglobin: 13 g/dL (ref 11.7–15.5)
Lymphs Abs: 902 cells/uL (ref 850–3900)
MCH: 28 pg (ref 27.0–33.0)
MCHC: 32.4 g/dL (ref 32.0–36.0)
MCV: 86.4 fL (ref 80.0–100.0)
MPV: 10.5 fL (ref 7.5–12.5)
Monocytes Relative: 21.4 %
Neutro Abs: 1497 cells/uL — ABNORMAL LOW (ref 1500–7800)
Neutrophils Relative %: 48.3 %
Platelets: 234 10*3/uL (ref 140–400)
RBC: 4.64 10*6/uL (ref 3.80–5.10)
RDW: 12.2 % (ref 11.0–15.0)
Total Lymphocyte: 29.1 %
WBC: 3.1 10*3/uL — ABNORMAL LOW (ref 3.8–10.8)

## 2021-10-16 LAB — LIPID PANEL
Cholesterol: 176 mg/dL (ref <200)
HDL: 72 mg/dL (ref >=50)
LDL Cholesterol (Calc): 88 mg/dL (calc)
Non-HDL Cholesterol (Calc): 104 mg/dL (calc) (ref <130)
Total CHOL/HDL Ratio: 2.4 (calc) (ref <5.0)
Triglycerides: 73 mg/dL (ref <150)

## 2021-10-16 LAB — HEMOGLOBIN A1C
Hgb A1c MFr Bld: 6 % of total Hgb — ABNORMAL HIGH (ref <5.7)
Mean Plasma Glucose: 126 mg/dL
eAG (mmol/L): 7 mmol/L

## 2021-10-17 ENCOUNTER — Other Ambulatory Visit: Payer: Self-pay | Admitting: *Deleted

## 2021-11-01 ENCOUNTER — Ambulatory Visit: Payer: BC Managed Care – PPO | Admitting: Surgical

## 2022-01-15 ENCOUNTER — Encounter (INDEPENDENT_AMBULATORY_CARE_PROVIDER_SITE_OTHER): Payer: BC Managed Care – PPO | Admitting: Ophthalmology

## 2022-01-15 ENCOUNTER — Other Ambulatory Visit: Payer: Self-pay

## 2022-01-15 DIAGNOSIS — H2513 Age-related nuclear cataract, bilateral: Secondary | ICD-10-CM

## 2022-01-15 DIAGNOSIS — H33302 Unspecified retinal break, left eye: Secondary | ICD-10-CM | POA: Diagnosis not present

## 2022-01-15 DIAGNOSIS — H43813 Vitreous degeneration, bilateral: Secondary | ICD-10-CM

## 2022-01-16 ENCOUNTER — Encounter (INDEPENDENT_AMBULATORY_CARE_PROVIDER_SITE_OTHER): Payer: BC Managed Care – PPO | Admitting: Ophthalmology

## 2022-01-17 ENCOUNTER — Other Ambulatory Visit: Payer: Self-pay

## 2022-01-17 ENCOUNTER — Encounter (INDEPENDENT_AMBULATORY_CARE_PROVIDER_SITE_OTHER): Payer: BC Managed Care – PPO | Admitting: Ophthalmology

## 2022-01-17 DIAGNOSIS — H33302 Unspecified retinal break, left eye: Secondary | ICD-10-CM

## 2022-01-23 ENCOUNTER — Encounter (INDEPENDENT_AMBULATORY_CARE_PROVIDER_SITE_OTHER): Payer: BC Managed Care – PPO | Admitting: Ophthalmology

## 2022-01-27 ENCOUNTER — Other Ambulatory Visit: Payer: Self-pay | Admitting: Nurse Practitioner

## 2022-01-27 DIAGNOSIS — Z1231 Encounter for screening mammogram for malignant neoplasm of breast: Secondary | ICD-10-CM

## 2022-01-31 ENCOUNTER — Encounter (INDEPENDENT_AMBULATORY_CARE_PROVIDER_SITE_OTHER): Payer: BC Managed Care – PPO | Admitting: Ophthalmology

## 2022-01-31 DIAGNOSIS — H33302 Unspecified retinal break, left eye: Secondary | ICD-10-CM

## 2022-02-26 ENCOUNTER — Ambulatory Visit
Admission: RE | Admit: 2022-02-26 | Discharge: 2022-02-26 | Disposition: A | Discharge status: Home / Self Care | Payer: BC Managed Care – PPO | Source: Ambulatory Visit | Attending: Nurse Practitioner | Admitting: Nurse Practitioner

## 2022-02-26 DIAGNOSIS — Z1231 Encounter for screening mammogram for malignant neoplasm of breast: Secondary | ICD-10-CM

## 2022-04-30 ENCOUNTER — Ambulatory Visit
Admission: EM | Admit: 2022-04-30 | Discharge: 2022-04-30 | Disposition: A | Discharge status: Home / Self Care | Payer: BC Managed Care – PPO | Attending: Internal Medicine | Admitting: Internal Medicine

## 2022-04-30 DIAGNOSIS — B372 Candidiasis of skin and nail: Secondary | ICD-10-CM | POA: Diagnosis not present

## 2022-04-30 MED ORDER — FLUCONAZOLE 150 MG PO TABS: 150.0000 mg | ORAL_TABLET | ORAL | 0 refills | Status: DC

## 2022-04-30 MED ORDER — CLOTRIMAZOLE 1 % EX CREA: TOPICAL_CREAM | CUTANEOUS | 0 refills | Status: DC

## 2022-04-30 NOTE — ED Provider Notes (Signed)
[facility]    CSN: [id number] Arrival date & time: 04/30/22  0834      History   Chief Complaint Chief Complaint  Patient presents with   Rash    Rashes on breast    HPI Brenda Hurst is a 61 y.o. female.   Patient presents with rash underneath bilateral breast that is extending around to back as well.  She reports it has been present for 1 month.  It is itchy and painful.  She has used ketoconazole cream for about 3 to 4 days with no improvement.  Denies changes in environment including lotions, soaps, detergents, foods, etc.  Denies any purulent drainage.  Denies any associated fever.   Rash   Past Medical History:  Diagnosis Date   GERD (gastroesophageal reflux disease)    HSV-1 infection     Patient Active Problem List   Diagnosis Date Noted   Cervical dysplasia 06/26/2011    Past Surgical History:  Procedure Laterality Date   KNEE SURGERY  12/2009   LIGAMENT    OB History     Gravida  1   Para  1   Term      Preterm      AB      Living  1      SAB      IAB      Ectopic      Multiple      Live Births               Home Medications    Prior to Admission medications   Medication Sig Start Date End Date Taking? Authorizing Provider  clotrimazole (LOTRIMIN) 1 % cream Apply to affected area 2 times daily 04/30/22  Yes Tajah Schreiner, Jenkins E, FNP  fluconazole (DIFLUCAN) 150 MG tablet Take 1 tablet (150 mg total) by mouth every 3 (three) days. Take first pill today.  May take second pill in 3 days if no resolution of symptoms with first pill.  May take third pill 3 days from second pill if no resolution of symptoms. 04/30/22  Yes Calvary Difranco, Acie Fredrickson, FNP  B Complex-C (SUPER B COMPLEX PO) Take 1 capsule by mouth daily.    [provider]  Biotin 1 MG CAPS Take 1 capsule by mouth daily.    [provider]  calcium carbonate (OS-CAL) 600 MG TABS tablet Take 600 mg by mouth 2 (two) times daily with a meal.    [provider]  fish oil-omega-3 fatty acids 1000 MG capsule Take 2 g by mouth daily.    [provider]  ibuprofen (ADVIL) 800 MG tablet Take 1 tablet (800 mg total) by mouth every 8 (eight) hours as needed. 09/13/21   Magnant, Joycie Peek, PA-C  Multiple Vitamin (MULTIVITAMIN) tablet Take 1 tablet by mouth daily.    [provider]  Naproxen Sodium 220 MG CAPS Take 2 capsules by mouth as needed.    [provider]  OVER THE COUNTER MEDICATION Take 2 capsules by mouth daily. Hair, Skin and Nails    [provider]  Red Yeast Rice Extract (RED YEAST RICE PO) Take 1 tablet by mouth daily.    [provider]    Family History Family History  Problem Relation Age of Onset   Hypertension Mother    Diabetes Sister    Hypertension Sister    Diabetes Brother    Hypertension Brother    Colon cancer Neg Hx    Colon  polyps Neg Hx    Rectal cancer Neg Hx    Stomach cancer Neg Hx     Social History Social History   Tobacco Use   Smoking status: Never   Smokeless tobacco: Never  Vaping Use   Vaping Use: Never used  Substance Use Topics   Alcohol use: No   Drug use: No     Allergies   Diclofenac sodium and Ibuprofen   Review of Systems Review of Systems Per HPI  Physical Exam Triage Vital Signs ED Triage Vitals  Enc Vitals Group     BP 04/30/22 0855 117/76     Pulse Rate 04/30/22 0855 (!) 56     Resp --      Temp 04/30/22 0855 98.5 F (36.9 C)     Temp Source 04/30/22 0855 Oral     SpO2 04/30/22 0855 98 %     Weight --      Height 04/30/22 0904 5\' 5"  (1.651 m)     Head Circumference --      Peak Flow --      Pain Score 04/30/22 0858 5     Pain Loc --      Pain Edu? --      Excl. in GC? --    No data found.  Updated Vital Signs BP 117/76 (BP Location: Left Arm)   Pulse (!) 56   Temp 98.5 F (36.9 C) (Oral)   Ht 5\' 5"  (1.651 m)   LMP 12/08/2011   SpO2 98%   BMI 25.29 kg/m   Visual Acuity Right Eye Distance:    Left Eye Distance:   Bilateral Distance:    Right Eye Near:   Left Eye Near:    Bilateral Near:     Physical Exam Exam conducted with a chaperone present.  Constitutional:      General: She is not in acute distress.    Appearance: Normal appearance. She is not toxic-appearing or diaphoretic.  HENT:     Head: Normocephalic and atraumatic.  Eyes:     Extraocular Movements: Extraocular movements intact.     Conjunctiva/sclera: Conjunctivae normal.  Pulmonary:     Effort: Pulmonary effort is normal.  Skin:    Comments: Erythematous satellite like lesions that are also papular in various areas present underneath bilateral breast in various areas on back.  No purulent drainage noted.  Neurological:     General: No focal deficit present.     Mental Status: She is alert and oriented to person, place, and time. Mental status is at baseline.  Psychiatric:        Mood and Affect: Mood normal.        Behavior: Behavior normal.        Thought Content: Thought content normal.        Judgment: Judgment normal.      UC Treatments / Results  Labs (all labs ordered are listed, but only abnormal results are displayed) Labs Reviewed - No data to display  EKG   Radiology No results found.  Procedures Procedures (including critical care time)  Medications Ordered in UC Medications - No data to display  Initial Impression / Assessment and Plan / UC Course  I have reviewed the triage vital signs and the nursing notes.  Pertinent labs & imaging results that were available during my care of the patient were reviewed by me and considered in my medical decision making (see chart for details).     Rash is consistent with  candidiasis of skin.  Will treat with clotrimazole cream as well as Diflucan pills.  No signs of bacterial infection on exam.  Patient advised that it may take 1 to 2 weeks to resolve.  Although, she was given strict return and ER precautions.  Discussed supportive care  with patient as well. patient verbalized understanding and was agreeable with plan. Final Clinical Impressions(s) / UC Diagnoses   Final diagnoses:  Candidal skin infection     Discharge Instructions      It appears that you have a yeast infection of your skin.  This is being treated with pill and cream.  It may take 1 to 2 weeks to resolve.  Please follow-up if symptoms persist or worsen.    ED Prescriptions     Medication Sig Dispense Auth. Provider   clotrimazole (LOTRIMIN) 1 % cream Apply to affected area 2 times daily 15 g Eustace, Rolly Salter E, FNP   fluconazole (DIFLUCAN) 150 MG tablet Take 1 tablet (150 mg total) by mouth every 3 (three) days. Take first pill today.  May take second pill in 3 days if no resolution of symptoms with first pill.  May take third pill 3 days from second pill if no resolution of symptoms. 3 tablet Fitzhugh, Acie Fredrickson, [place]      PDMP not reviewed this encounter.   Gustavus Bryant, [place] 04/30/22 [phone]

## 2022-04-30 NOTE — ED Triage Notes (Signed)
Pt report rash on breast Red bumps patches extend to back  Itch , sometime hurt on the back stabbing  Using cream ketoconazole but did not help  Onset : A month ago

## 2022-04-30 NOTE — Discharge Instructions (Signed)
It appears that you have a yeast infection of your skin.  This is being treated with pill and cream.  It may take 1 to 2 weeks to resolve.  Please follow-up if symptoms persist or worsen.

## 2022-05-08 ENCOUNTER — Encounter (HOSPITAL_COMMUNITY): Payer: Self-pay | Admitting: Emergency Medicine

## 2022-05-08 ENCOUNTER — Ambulatory Visit (HOSPITAL_COMMUNITY)
Admission: EM | Admit: 2022-05-08 | Discharge: 2022-05-08 | Disposition: A | Discharge status: Home / Self Care | Payer: BC Managed Care – PPO | Attending: Internal Medicine | Admitting: Internal Medicine

## 2022-05-08 DIAGNOSIS — B354 Tinea corporis: Secondary | ICD-10-CM | POA: Diagnosis not present

## 2022-05-08 DIAGNOSIS — L0292 Furuncle, unspecified: Secondary | ICD-10-CM | POA: Diagnosis not present

## 2022-05-08 MED ORDER — CEPHALEXIN 500 MG PO CAPS: 500.0000 mg | ORAL_CAPSULE | Freq: Three times a day (TID) | ORAL | 0 refills | Status: AC

## 2022-05-08 MED ORDER — TERBINAFINE HCL 250 MG PO TABS: 250.0000 mg | ORAL_TABLET | Freq: Every day | ORAL | 0 refills | Status: AC

## 2022-05-08 MED ORDER — HYDROXYZINE HCL 25 MG PO TABS: 25.0000 mg | ORAL_TABLET | Freq: Three times a day (TID) | ORAL | 0 refills | Status: DC | PRN

## 2022-05-08 MED ORDER — MICONAZOLE NITRATE 2 % EX AERP: INHALATION_SPRAY | CUTANEOUS | 0 refills | Status: DC

## 2022-05-08 NOTE — Discharge Instructions (Addendum)
Please take medications as prescribed Use powder to affected areas Tylenol as needed for pain If you have worsening symptoms please return to urgent care to be reevaluated.

## 2022-05-08 NOTE — ED Triage Notes (Signed)
Pt reports a rash all over. States she was seen on 04/30/22 for same and was given a prescription that did not help.

## 2022-05-10 NOTE — ED Provider Notes (Signed)
[facility]    CSN: [id number] Arrival date & time: 05/08/22  1026      History   Chief Complaint Chief Complaint  Patient presents with   Rash    HPI Brenda Hurst is a 61 y.o. female comes to the urgent care for itchy rash below her breasts and on her torso as well.  Patient was seen a week ago and prescribed clotrimazole cream and topical antifungal cream.  Patient says the rash has worsened since he started using the steroid cream.  Area is somewhat painful now with the slight redness of the area.  She is also developed rashes over the torso and also on the upper thigh.  No fever or chills.   HPI  Past Medical History:  Diagnosis Date   GERD (gastroesophageal reflux disease)    HSV-1 infection     Patient Active Problem List   Diagnosis Date Noted   Cervical dysplasia 06/26/2011    Past Surgical History:  Procedure Laterality Date   KNEE SURGERY  12/2009   LIGAMENT    OB History     Gravida  1   Para  1   Term      Preterm      AB      Living  1      SAB      IAB      Ectopic      Multiple      Live Births               Home Medications    Prior to Admission medications   Medication Sig Start Date End Date Taking? Authorizing Provider  cephALEXin (KEFLEX) 500 MG capsule Take 1 capsule (500 mg total) by mouth 3 (three) times daily for 5 days. 05/08/22 05/13/22 Yes Baldomero Mirarchi, Britta Mccreedy, MD  hydrOXYzine (ATARAX) 25 MG tablet Take 1 tablet (25 mg total) by mouth every 8 (eight) hours as needed. 05/08/22  Yes Ragnar Waas, Britta Mccreedy, MD  Miconazole Nitrate 2 % AERP Apply to affected area daily. 05/08/22  Yes Consandra Laske, Britta Mccreedy, MD  terbinafine (LAMISIL) 250 MG tablet Take 1 tablet (250 mg total) by mouth daily for 10 days. 05/08/22 05/18/22 Yes Marvyn Torrez, Britta Mccreedy, MD  B Complex-C (SUPER B COMPLEX PO) Take 1 capsule by mouth daily.    [provider]  Biotin 1 MG CAPS Take 1 capsule by mouth daily.    [provider]  calcium  carbonate (OS-CAL) 600 MG TABS tablet Take 600 mg by mouth 2 (two) times daily with a meal.    [provider]  fish oil-omega-3 fatty acids 1000 MG capsule Take 2 g by mouth daily.    [provider]  ibuprofen (ADVIL) 800 MG tablet Take 1 tablet (800 mg total) by mouth every 8 (eight) hours as needed. 09/13/21   Magnant, Joycie Peek, PA-C  Multiple Vitamin (MULTIVITAMIN) tablet Take 1 tablet by mouth daily.    [provider]  Naproxen Sodium 220 MG CAPS Take 2 capsules by mouth as needed.    [provider]  OVER THE COUNTER MEDICATION Take 2 capsules by mouth daily. Hair, Skin and Nails    [provider]  Red Yeast Rice Extract (RED YEAST RICE PO) Take 1 tablet by mouth daily.    [provider]    Family History Family History  Problem Relation Age of Onset   Hypertension Mother    Diabetes Sister    Hypertension Sister  Diabetes Brother    Hypertension Brother    Colon cancer Neg Hx    Colon polyps Neg Hx    Rectal cancer Neg Hx    Stomach cancer Neg Hx     Social History Social History   Tobacco Use   Smoking status: Never   Smokeless tobacco: Never  Vaping Use   Vaping Use: Never used  Substance Use Topics   Alcohol use: No   Drug use: No     Allergies   Diclofenac sodium and Ibuprofen   Review of Systems Review of Systems  Musculoskeletal: Negative.   Skin:  Positive for color change and rash. Negative for wound.  Neurological: Negative.      Physical Exam Triage Vital Signs ED Triage Vitals  Enc Vitals Group     BP 05/08/22 1113 106/66     Pulse Rate 05/08/22 1113 60     Resp 05/08/22 1113 17     Temp 05/08/22 1113 98.4 F (36.9 C)     Temp Source 05/08/22 1113 Oral     SpO2 05/08/22 1113 98 %     Weight --      Height --      Head Circumference --      Peak Flow --      Pain Score 05/08/22 1111 6     Pain Loc --      Pain Edu? --      Excl. in GC? --    No data found.  Updated  Vital Signs BP 106/66 (BP Location: Right Arm)   Pulse 60   Temp 98.4 F (36.9 C) (Oral)   Resp 17   LMP 12/08/2011   SpO2 98%   Visual Acuity Right Eye Distance:   Left Eye Distance:   Bilateral Distance:    Right Eye Near:   Left Eye Near:    Bilateral Near:     Physical Exam Vitals and nursing note reviewed.  Constitutional:      Appearance: Normal appearance.  Cardiovascular:     Rate and Rhythm: Normal rate and regular rhythm.  Musculoskeletal:        General: Normal range of motion.  Skin:    Comments: Papular rash in the inframammary areas bilaterally.  Some weeping and mild erythema.  Patient also has multiple pustules over the torso.    Neurological:     Mental Status: She is alert.      UC Treatments / Results  Labs (all labs ordered are listed, but only abnormal results are displayed) Labs Reviewed - No data to display  EKG   Radiology No results found.  Procedures Procedures (including critical care time)  Medications Ordered in UC Medications - No data to display  Initial Impression / Assessment and Plan / UC Course  I have reviewed the triage vital signs and the nursing notes.  Pertinent labs & imaging results that were available during my care of the patient were reviewed by me and considered in my medical decision making (see chart for details).     1.  Tinea corporis, failed topical antifungal agents Lamisil 250 mg orally daily for 10 days Hydroxyzine as needed for itching Miconazole powder to be applied in the inframammary area Return precautions given  2.  Funrunculosis: Keflex 500 mg 3 times daily for 5 days Tylenol/Motrin as needed for pain Return precautions given. Final Clinical Impressions(s) / UC Diagnoses   Final diagnoses:  Tinea corporis  Furunculosis     Discharge  Instructions      Please take medications as prescribed Use powder to affected areas Tylenol as needed for pain If you have worsening symptoms  please return to urgent care to be reevaluated.   ED Prescriptions     Medication Sig Dispense Auth. Provider   terbinafine (LAMISIL) 250 MG tablet Take 1 tablet (250 mg total) by mouth daily for 10 days. 10 tablet Yecenia Dalgleish, Britta Mccreedy, MD   hydrOXYzine (ATARAX) 25 MG tablet Take 1 tablet (25 mg total) by mouth every 8 (eight) hours as needed. 24 tablet Alyzza Andringa, Britta Mccreedy, MD   Miconazole Nitrate 2 % AERP Apply to affected area daily. -- Merrilee Jansky, MD   cephALEXin (KEFLEX) 500 MG capsule Take 1 capsule (500 mg total) by mouth 3 (three) times daily for 5 days. 15 capsule Shakhia Gramajo, Britta Mccreedy, MD      PDMP not reviewed this encounter.   Merrilee Jansky, MD 05/10/22 1004

## 2022-05-23 ENCOUNTER — Telehealth: Payer: Self-pay | Admitting: *Deleted

## 2022-05-23 ENCOUNTER — Encounter: Payer: Self-pay | Admitting: Nurse Practitioner

## 2022-05-23 ENCOUNTER — Ambulatory Visit: Payer: BC Managed Care – PPO | Admitting: Nurse Practitioner

## 2022-05-23 VITALS — BP 102/68 | HR 67

## 2022-05-23 DIAGNOSIS — L299 Pruritus, unspecified: Secondary | ICD-10-CM

## 2022-05-23 DIAGNOSIS — B369 Superficial mycosis, unspecified: Secondary | ICD-10-CM | POA: Diagnosis not present

## 2022-05-23 MED ORDER — FLUCONAZOLE 200 MG PO TABS: 200.0000 mg | ORAL_TABLET | ORAL | 0 refills | Status: AC

## 2022-05-23 MED ORDER — ZINC OXIDE 13 % EX CREA: TOPICAL_OINTMENT | Freq: Three times a day (TID) | CUTANEOUS | 0 refills | Status: DC

## 2022-05-23 MED ORDER — HYDROXYZINE HCL 25 MG PO TABS: 25.0000 mg | ORAL_TABLET | Freq: Three times a day (TID) | ORAL | 0 refills | Status: DC | PRN

## 2022-05-23 NOTE — Telephone Encounter (Signed)
Left message on voicemail below Rx was sent

## 2022-05-23 NOTE — Progress Notes (Signed)
   Acute Office Visit  Subjective:    Patient ID: Brenda Hurst, female    DOB: [birth year], 61 y.o.   MRN: [id number]   HPI 61 y.o. presents today for chest rash that appeared about a month ago. Rash is under and in between breasts. Was seen at [facility] 04/30/2022 and 05/08/2022. Treatments have included Clotrimazole cream, Diflucan (3 doses) first, then treated with Keflex, Lamisil, hydroxyzine, and miconazole powder. She has had some improvement but now new sores are popping up. Rash is itchy and sore. No changes in soaps, detergents, skin care products, or medications.    Review of Systems  Constitutional: Negative.   Skin:  Positive for rash (Chest, under breasts).       Objective:    Physical Exam Constitutional:      Appearance: Normal appearance.  Skin:    Findings: Rash (under breasts, central chest) present. Rash is nodular and pustular. Rash is not crusting, scaling or vesicular.     BP 102/68   Pulse 67   LMP 12/08/2011   SpO2 98%  Wt Readings from Last 3 Encounters:  10/10/21 152 lb (68.9 kg)  09/27/20 162 lb (73.5 kg)  05/25/20 162 lb (73.5 kg)        Patient informed chaperone available to be present for breast and/or pelvic exam. Patient has requested no chaperone to be present. Patient has been advised what will be completed during breast and pelvic exam.   Assessment & Plan:   Problem List Items Addressed This Visit   None Visit Diagnoses     Fungal infection of skin    -  Primary   Relevant Medications   mupirocin 2% oint-hydrocortisone 2.5% cream-nystatin cream-zinc oxide 13% oint 1:1:1:5 mixture   fluconazole (DIFLUCAN) 200 MG tablet   Pruritus       Relevant Medications   hydrOXYzine (ATARAX) 25 MG tablet      Plan: Topical and oral treatment failed for what appears to be a fungal infection. She does have areas of pustules. Sores appear to be in different stages of healing. Recommend dermatology referral and she is agreeable. Diflucan 200 mg every  other day x 14 days. Will provide Hydroxyzine as needed and topical ointment. She is agreeable to plan.      Olivia Mackie DNP, 10:20 AM 05/23/2022

## 2022-05-23 NOTE — Telephone Encounter (Signed)
-----   Message from Brenda Mackie, NP sent at 05/23/2022 10:21 AM EDT ----- Regarding: Med update Please let Tee know I did also send in Diflucan to be taken every other day for 14 days along with the topical treatment.

## 2022-05-26 ENCOUNTER — Telehealth: Payer: Self-pay | Admitting: *Deleted

## 2022-05-26 DIAGNOSIS — B369 Superficial mycosis, unspecified: Secondary | ICD-10-CM

## 2022-05-26 NOTE — Telephone Encounter (Signed)
Referral placed at [facility] at [facility]. They will call patient to schedule.

## 2022-05-26 NOTE — Telephone Encounter (Signed)
-----   Message from Olivia Mackie, NP sent at 05/23/2022 10:11 AM EDT ----- Regarding: Derm referral Please send derm referral for fungal infection of skin. Thanks.

## 2022-05-28 NOTE — Telephone Encounter (Signed)
[facility] is closing its practice. I called patient and gave her the # to [facility] not referral is needed per this office. Patient can call and schedule, number given to patient to call. I told her if she has any issues to call and let us know.

## 2022-06-10 ENCOUNTER — Encounter (INDEPENDENT_AMBULATORY_CARE_PROVIDER_SITE_OTHER): Payer: BC Managed Care – PPO | Admitting: Ophthalmology

## 2022-06-10 DIAGNOSIS — H43813 Vitreous degeneration, bilateral: Secondary | ICD-10-CM | POA: Diagnosis not present

## 2022-06-10 DIAGNOSIS — H33302 Unspecified retinal break, left eye: Secondary | ICD-10-CM

## 2022-09-12 ENCOUNTER — Telehealth: Payer: Self-pay | Admitting: *Deleted

## 2022-09-12 NOTE — Telephone Encounter (Signed)
Patient called c/o painful intercourse only due to vaginal dryness asked what should could do to help with this. I informed patient she can try OTC lubrication and no relief with this I suggest an office visit.   Just FYI

## 2022-09-24 ENCOUNTER — Other Ambulatory Visit: Payer: Self-pay | Admitting: Nurse Practitioner

## 2022-09-25 NOTE — Telephone Encounter (Signed)
Electronic refill request received for doxycycline 100 mg tab BID PRN.   No Rx on file.   Call placed to patient, left detailed message requesting return call to provide more info regarding refill request. Call [facility] Triage, [phone], OPT 4.

## 2022-10-02 NOTE — Telephone Encounter (Signed)
[name] patient never called back, do you want to deny Rx so it can be removed for refill pool?

## 2022-10-08 ENCOUNTER — Encounter: Payer: Self-pay | Admitting: Nurse Practitioner

## 2022-10-13 NOTE — Progress Notes (Unsigned)
Brenda Hurst 1961/07/07 [id number]   History:  61 y.o. G 1P1 presents for annual exam. Complains of pain with intercourse due to dryness. Uses OTC lubricant with no relief. Also reports intermittent brown vaginal spotting that occurs 1-2 times per month. Very light, mostly just with wiping and does not  last. Is not related to intercourse. Postmenopausal - no HRT. 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 Normal mammogram history.   Gynecologic History Patient's last menstrual period was 12/08/2011.   Contraception: post menopausal status Sexually active: No  Health maintenance Last Pap: 09/20/2019. Results were: Normal, 5-year repeat Last mammogram: 02/26/2022. Results were: Normal Last colonoscopy: 05/09/2015. Results were: Normal, 10-year recall Last Dexa: 10/13/2018. Results were: Normal  Past medical history, past surgical history, family history and social history were all reviewed and documented in the EPIC chart. Works in Estate manager/land agent at [employer]. 62 yo son graduated from [employer] in May. Working at [employer].    ROS:  A ROS was performed and pertinent positives and negatives are included.  Exam:  Vitals:   10/14/22 0837  BP: 108/74  Pulse: 81  SpO2: 96%  Weight: 157 lb (71.2 kg)  Height: 5\' 5"  (1.651 m)    Body mass index is 26.13 kg/m.  General appearance:  Normal Thyroid:  Symmetrical, normal in size, without palpable masses or nodularity. Respiratory  Auscultation:  Clear without wheezing or rhonchi Cardiovascular  Auscultation:  Regular rate, without rubs, murmurs or gallops  Edema/varicosities:  Not grossly evident Abdominal  Soft,nontender, without masses, guarding or rebound.  Liver/spleen:  No organomegaly noted  Hernia:  None appreciated  Skin  Inspection:  Grossly normal    Breasts: Examined lying and sitting.   Right: Without masses, retractions, discharge or axillary adenopathy.   Left: Without masses, retractions, discharge or axillary  adenopathy. Genitourinary   Inguinal/mons:  Normal without inguinal adenopathy  External genitalia:  Normal appearing vulva with no masses, tenderness, or lesions  BUS/Urethra/Skene's glands:  Normal  Vagina:  Normal appearing with normal color and discharge, no lesions. Atrophic changes  Cervix:  Normal appearing without discharge or lesions  Uterus:  Normal in size, shape and contour.  Midline and mobile, nontender  Adnexa/parametria:     Rt: Normal in size, without masses or tenderness.   Lt: Normal in size, without masses or tenderness.  Anus and perineum: Normal  Digital rectal exam: Normal sphincter tone without palpated masses or tenderness  Patient informed chaperone available to be present for breast and pelvic exam. Patient has requested no chaperone to be present. Patient has been advised what will be completed during breast and pelvic exam.   Assessment/Plan:  61 y.o. G1P1 for annual exam.   Well female exam with routine gynecological exam - Plan: CBC with Differential/Platelet, Comprehensive metabolic panel. Education provided on SBEs, importance of preventative screenings, current guidelines, high calcium diet, regular exercise, and multivitamin daily.   Postmenopausal - no HRT  Prediabetes - Plan: Hemoglobin A1c. A1c one year ago 6.0. Recommended diet and exercise for management. Recommend PCP for management.   History of hyperlipidemia - Plan: Lipid panel  Dyspareunia in female - Plan: estradiol (ESTRACE VAGINAL) 0.1 MG/GM vaginal cream twice weekly. Will use initially nightly x 2 weeks, then twice weekly. Will wait to use until after ultrasound.   Postmenopausal bleeding - Plan: US PELVIS TRANSVAGINAL NON-OB (TV ONLY). reports intermittent brown vaginal spotting that occurs 1-2 times per month. Very light, mostly just with wiping and does not  last. Is not related to  intercourse.  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  Will repeat at 5-year  interval per guidelines.   Screening for breast cancer - Normal mammogram history.  Continue annual screenings.  Normal breast exam today.  Screening for colon cancer - 2016 colonoscopy. Will repeat at 10-year interval per GI recommendation.  Screening for osteoporosis - Normal bone density 2019. Will repeat DXA at age 61.   Follow-up in 1 year for annual.     Olivia Mackie [facility], 9:11 AM 10/14/2022

## 2022-10-14 ENCOUNTER — Encounter: Payer: Self-pay | Admitting: Nurse Practitioner

## 2022-10-14 ENCOUNTER — Ambulatory Visit (INDEPENDENT_AMBULATORY_CARE_PROVIDER_SITE_OTHER): Payer: BC Managed Care – PPO | Admitting: Nurse Practitioner

## 2022-10-14 VITALS — BP 108/74 | HR 81 | Ht 65.0 in | Wt 157.0 lb

## 2022-10-14 DIAGNOSIS — N950 Postmenopausal bleeding: Secondary | ICD-10-CM | POA: Diagnosis not present

## 2022-10-14 DIAGNOSIS — Z01419 Encounter for gynecological examination (general) (routine) without abnormal findings: Secondary | ICD-10-CM

## 2022-10-14 DIAGNOSIS — N941 Unspecified dyspareunia: Secondary | ICD-10-CM

## 2022-10-14 DIAGNOSIS — Z780 Asymptomatic menopausal state: Secondary | ICD-10-CM

## 2022-10-14 DIAGNOSIS — Z8639 Personal history of other endocrine, nutritional and metabolic disease: Secondary | ICD-10-CM

## 2022-10-14 DIAGNOSIS — R7303 Prediabetes: Secondary | ICD-10-CM

## 2022-10-14 MED ORDER — ESTRADIOL 0.1 MG/GM VA CREA: 1.0000 g | TOPICAL_CREAM | VAGINAL | 2 refills | Status: DC

## 2022-10-15 LAB — CBC WITH DIFFERENTIAL/PLATELET
Absolute Monocytes: 286 cells/uL (ref 200–950)
Basophils Absolute: 29 cells/uL (ref 0–200)
Basophils Relative: 0.7 %
Eosinophils Absolute: 197 cells/uL (ref 15–500)
Eosinophils Relative: 4.7 %
HCT: 40.2 % (ref 35.0–45.0)
Hemoglobin: 13.2 g/dL (ref 11.7–15.5)
Lymphs Abs: 1604 cells/uL (ref 850–3900)
MCH: 28.3 pg (ref 27.0–33.0)
MCHC: 32.8 g/dL (ref 32.0–36.0)
MCV: 86.1 fL (ref 80.0–100.0)
MPV: 10.3 fL (ref 7.5–12.5)
Monocytes Relative: 6.8 %
Neutro Abs: 2083 cells/uL (ref 1500–7800)
Neutrophils Relative %: 49.6 %
Platelets: 302 10*3/uL (ref 140–400)
RBC: 4.67 10*6/uL (ref 3.80–5.10)
RDW: 11.8 % (ref 11.0–15.0)
Total Lymphocyte: 38.2 %
WBC: 4.2 10*3/uL (ref 3.8–10.8)

## 2022-10-15 LAB — HEMOGLOBIN A1C
Hgb A1c MFr Bld: 6 % of total Hgb — ABNORMAL HIGH (ref <5.7)
Mean Plasma Glucose: 126 mg/dL
eAG (mmol/L): 7 mmol/L

## 2022-10-15 LAB — LIPID PANEL
Cholesterol: 211 mg/dL — ABNORMAL HIGH (ref <200)
HDL: 65 mg/dL (ref >=50)
LDL Cholesterol (Calc): 120 mg/dL (calc) — ABNORMAL HIGH
Non-HDL Cholesterol (Calc): 146 mg/dL (calc) — ABNORMAL HIGH (ref <130)
Total CHOL/HDL Ratio: 3.2 (calc) (ref <5.0)
Triglycerides: 150 mg/dL — ABNORMAL HIGH (ref <150)

## 2022-10-15 LAB — COMPREHENSIVE METABOLIC PANEL
AG Ratio: 1.3 (calc) (ref 1.0–2.5)
ALT: 12 U/L (ref 6–29)
AST: 14 U/L (ref 10–35)
Albumin: 4.3 g/dL (ref 3.6–5.1)
Alkaline phosphatase (APISO): 67 U/L (ref 37–153)
BUN: 10 mg/dL (ref 7–25)
CO2: 30 mmol/L (ref 20–32)
Calcium: 9.9 mg/dL (ref 8.6–10.4)
Chloride: 105 mmol/L (ref 98–110)
Creat: 0.6 mg/dL (ref 0.50–1.05)
Globulin: 3.3 g/dL (calc) (ref 1.9–3.7)
Glucose, Bld: 101 mg/dL — ABNORMAL HIGH (ref 65–99)
Potassium: 4.9 mmol/L (ref 3.5–5.3)
Sodium: 142 mmol/L (ref 135–146)
Total Bilirubin: 0.2 mg/dL (ref 0.2–1.2)
Total Protein: 7.6 g/dL (ref 6.1–8.1)

## 2022-11-04 ENCOUNTER — Ambulatory Visit: Payer: BC Managed Care – PPO | Admitting: Nurse Practitioner

## 2022-11-04 ENCOUNTER — Ambulatory Visit (INDEPENDENT_AMBULATORY_CARE_PROVIDER_SITE_OTHER): Payer: BC Managed Care – PPO

## 2022-11-04 VITALS — BP 122/82 | HR 67

## 2022-11-04 DIAGNOSIS — N950 Postmenopausal bleeding: Secondary | ICD-10-CM

## 2022-11-04 NOTE — Progress Notes (Signed)
   Acute Office Visit  Subjective:    Patient ID: Brenda Hurst, female    DOB: 1960-11-27, 62 y.o.   MRN: [id number]   HPI 62 y.o. presents today for ultrasound for postmenopausal bleeding. Seen 10/14/22 for annual exam with reports of intermittent brown vaginal spotting that occurs 1-2 times per month. Very light, mostly just with wiping and does not  last. Is not related to intercourse, although she does have pain with intercourse due to dryness. Not on HRT.    Review of Systems  Constitutional: Negative.   Genitourinary: Negative.        Objective:    Physical Exam Constitutional:      Appearance: Normal appearance.   GU: Not indicated  BP 122/82   Pulse 67   LMP 12/08/2011   SpO2 97%  Wt Readings from Last 3 Encounters:  10/14/22 157 lb (71.2 kg)  10/10/21 152 lb (68.9 kg)  09/27/20 162 lb (73.5 kg)        Assessment & Plan:   Problem List Items Addressed This Visit   None Visit Diagnoses     Postmenopausal bleeding    -  Primary      Vaginal ultrasound:  Anteverted uterus, normal size and shape. Multiple fibroids noted 1.1 cm or less in size. Thin, symmetrical endometrium - 1.6 mm. No masses or thickening seen, fluid seen in endometrial canal, avascular. Ovaries atrophic, no masses seen. No adnexal mass, no free fluid.    Plan: Reviewed normal ultrasound findings. Will continue to monitor.      Tamela Gammon DNP, 3:54 PM 11/04/2022

## 2023-01-02 ENCOUNTER — Ambulatory Visit: Payer: BC Managed Care – PPO | Admitting: Family Medicine

## 2023-01-06 ENCOUNTER — Ambulatory Visit (INDEPENDENT_AMBULATORY_CARE_PROVIDER_SITE_OTHER): Payer: BC Managed Care – PPO | Admitting: Family Medicine

## 2023-01-06 ENCOUNTER — Encounter: Payer: Self-pay | Admitting: Family Medicine

## 2023-01-06 VITALS — BP 112/71 | HR 66 | Temp 97.7°F | Resp 16 | Ht 65.0 in | Wt 157.0 lb

## 2023-01-06 DIAGNOSIS — E785 Hyperlipidemia, unspecified: Secondary | ICD-10-CM

## 2023-01-06 DIAGNOSIS — Z7689 Persons encountering health services in other specified circumstances: Secondary | ICD-10-CM | POA: Diagnosis not present

## 2023-01-06 NOTE — Progress Notes (Signed)
Patient is here to established care with provider today. Patient has many health concern they would like to discuss with provider today  Care gaps discuss at appointment today  

## 2023-01-06 NOTE — Progress Notes (Signed)
New Patient Office Visit  Subjective    Patient ID: Joury Guider, female    DOB: 13-Jan-1961  Age: 62 y.o. MRN: [id number]  CC: No chief complaint on file.   HPI Alyena Roudabush presents to establish care and for review of chronic meds issues. Patient denies acute complaints or concerns.    Outpatient Encounter Medications as of 01/06/2023  Medication Sig   B Complex-C (SUPER B COMPLEX PO) Take 1 capsule by mouth daily.   Biotin 1 MG CAPS Take 1 capsule by mouth daily.   calcium carbonate (OS-CAL) 600 MG TABS tablet Take 600 mg by mouth 2 (two) times daily with a meal.   diazepam (VALIUM) 10 MG tablet SMARTSIG:1 Tablet(s) By Mouth   estradiol (ESTRACE VAGINAL) 0.1 MG/GM vaginal cream Place 1 g vaginally 2 (two) times a week. Initial dose: nightly x 2 weeks, then twice weekly   fish oil-omega-3 fatty acids 1000 MG capsule Take 2 g by mouth daily.   ibuprofen (ADVIL) 800 MG tablet Take 1 tablet (800 mg total) by mouth every 8 (eight) hours as needed.   Multiple Vitamin (MULTIVITAMIN) tablet Take 1 tablet by mouth daily.   Naproxen Sodium 220 MG CAPS Take 2 capsules by mouth as needed.   Omega-3 Fatty Acids (FISH OIL PO) Take by mouth.   Red Yeast Rice Extract (RED YEAST RICE PO) Take 1 tablet by mouth daily.   hydrOXYzine (ATARAX) 25 MG tablet Take 1 tablet (25 mg total) by mouth 3 (three) times daily as needed. (Patient not taking: Reported on 01/06/2023)   No facility-administered encounter medications on file as of 01/06/2023.    Past Medical History:  Diagnosis Date   GERD (gastroesophageal reflux disease)    HSV-1 infection     Past Surgical History:  Procedure Laterality Date   KNEE SURGERY  12/2009   LIGAMENT    Family History  Problem Relation Age of Onset   Hypertension Mother    Diabetes Sister    Hypertension Sister    Diabetes Brother    Hypertension Brother    Colon cancer Neg Hx    Colon polyps Neg Hx    Rectal cancer Neg Hx    Stomach cancer Neg Hx      Social History   Socioeconomic History   Marital status: Divorced    Spouse name: Not on file   Number of children: Not on file   Years of education: Not on file   Highest education level: Not on file  Occupational History   Not on file  Tobacco Use   Smoking status: Never   Smokeless tobacco: Never  Vaping Use   Vaping Use: Never used  Substance and Sexual Activity   Alcohol use: No   Drug use: No   Sexual activity: Not Currently    Birth control/protection: Post-menopausal    Comment: First IC >16 y/o, <5 Partners, des neg  Other Topics Concern   Not on file  Social History Narrative   Not on file   Social Determinants of Health   Financial Resource Strain: Low Risk  (01/06/2023)   Overall Financial Resource Strain (CARDIA)    Difficulty of Paying Living Expenses: Not hard at all  Food Insecurity: No Food Insecurity (01/06/2023)   Hunger Vital Sign    Worried About Running Out of Food in the Last Year: Never true    Ran Out of Food in the Last Year: Never true  Transportation Needs: No Transportation Needs (01/06/2023)  PRAPARE - Hydrologist (Medical): No    Lack of Transportation (Non-Medical): No  Physical Activity: Insufficiently Active (01/06/2023)   Exercise Vital Sign    Days of Exercise per Week: 1 day    Minutes of Exercise per Session: 30 min  Stress: No Stress Concern Present (01/06/2023)   [place]    Feeling of Stress : Not at all  Social Connections: Moderately Isolated (01/06/2023)   Social Connection and Isolation Panel [NHANES]    Frequency of Communication with Friends and Family: Twice a week    Frequency of Social Gatherings with Friends and Family: Twice a week    Attends Religious Services: More than 4 times per year    Active Member of [employer] or Organizations: No    Attends Archivist Meetings: Never    Marital Status: Divorced   Human resources officer Violence: Not At Risk (01/06/2023)   Humiliation, Afraid, Rape, and Kick questionnaire    Fear of Current or Ex-Partner: No    Emotionally Abused: No    Physically Abused: No    Sexually Abused: No    Review of Systems  All other systems reviewed and are negative.       Objective    BP 112/71   Pulse 66   Temp 97.7 F (36.5 C) (Oral)   Resp 16   Ht 5\' 5"  (1.651 m)   Wt 157 lb (71.2 kg)   LMP 12/08/2011   SpO2 97%   BMI 26.13 kg/m   Physical Exam Vitals and nursing note reviewed.  Constitutional:      General: She is not in acute distress. Cardiovascular:     Rate and Rhythm: Normal rate and regular rhythm.  Pulmonary:     Effort: Pulmonary effort is normal.     Breath sounds: Normal breath sounds.  Neurological:     General: No focal deficit present.     Mental Status: She is alert and oriented to person, place, and time.         Assessment & Plan:   1. Hyperlipidemia, unspecified hyperlipidemia type Continue present management.   2. Encounter to establish care     Return in about 3 months (around 04/08/2023) for physical.   Becky Sax, MD

## 2023-02-03 ENCOUNTER — Other Ambulatory Visit: Payer: Self-pay | Admitting: Nurse Practitioner

## 2023-02-03 DIAGNOSIS — Z1231 Encounter for screening mammogram for malignant neoplasm of breast: Secondary | ICD-10-CM

## 2023-03-17 ENCOUNTER — Ambulatory Visit
Admission: RE | Admit: 2023-03-17 | Discharge: 2023-03-17 | Disposition: A | Discharge status: Home / Self Care | Payer: BC Managed Care – PPO | Source: Ambulatory Visit | Attending: Nurse Practitioner | Admitting: Nurse Practitioner

## 2023-03-17 DIAGNOSIS — Z1231 Encounter for screening mammogram for malignant neoplasm of breast: Secondary | ICD-10-CM

## 2023-04-21 ENCOUNTER — Ambulatory Visit (INDEPENDENT_AMBULATORY_CARE_PROVIDER_SITE_OTHER): Payer: BC Managed Care – PPO | Admitting: Family Medicine

## 2023-04-21 VITALS — BP 104/67 | HR 74 | Temp 98.1°F | Resp 16 | Ht 65.0 in | Wt 160.0 lb

## 2023-04-21 DIAGNOSIS — E785 Hyperlipidemia, unspecified: Secondary | ICD-10-CM

## 2023-04-21 NOTE — Progress Notes (Signed)
-  Patient is here to have annually  complete physical examination  -Care gap address -labs taken  

## 2023-04-22 LAB — LIPID PANEL
Chol/HDL Ratio: 2.5 ratio (ref 0.0–4.4)
Cholesterol, Total: 170 mg/dL (ref 100–199)
HDL: 69 mg/dL (ref >39)
LDL Chol Calc (NIH): 78 mg/dL (ref 0–99)
Triglycerides: 131 mg/dL (ref 0–149)
VLDL Cholesterol Cal: 23 mg/dL (ref 5–40)

## 2023-04-24 ENCOUNTER — Encounter: Payer: Self-pay | Admitting: Family Medicine

## 2023-04-24 NOTE — Progress Notes (Signed)
Established Patient Office Visit  Subjective    Patient ID: Brenda Hurst, female    DOB: 07-16-1961  Age: 62 y.o. MRN: [id number]  CC:  Chief Complaint  Patient presents with   Annual Exam    HPI Brenda Hurst presents for follow up hyperlipidemia. Patient denies acute complaints or concerns.    Outpatient Encounter Medications as of 04/21/2023  Medication Sig   B Complex-C (SUPER B COMPLEX PO) Take 1 capsule by mouth daily.   Biotin 1 MG CAPS Take 1 capsule by mouth daily.   calcium carbonate (OS-CAL) 600 MG TABS tablet Take 600 mg by mouth 2 (two) times daily with a meal.   diazepam (VALIUM) 10 MG tablet SMARTSIG:1 Tablet(s) By Mouth   estradiol (ESTRACE VAGINAL) 0.1 MG/GM vaginal cream Place 1 g vaginally 2 (two) times a week. Initial dose: nightly x 2 weeks, then twice weekly   fish oil-omega-3 fatty acids 1000 MG capsule Take 2 g by mouth daily.   hydrOXYzine (ATARAX) 25 MG tablet Take 1 tablet (25 mg total) by mouth 3 (three) times daily as needed.   ibuprofen (ADVIL) 800 MG tablet Take 1 tablet (800 mg total) by mouth every 8 (eight) hours as needed.   Multiple Vitamin (MULTIVITAMIN) tablet Take 1 tablet by mouth daily.   Naproxen Sodium 220 MG CAPS Take 2 capsules by mouth as needed.   Omega-3 Fatty Acids (FISH OIL PO) Take by mouth.   Red Yeast Rice Extract (RED YEAST RICE PO) Take 1 tablet by mouth daily.   No facility-administered encounter medications on file as of 04/21/2023.    Past Medical History:  Diagnosis Date   GERD (gastroesophageal reflux disease)    HSV-1 infection     Past Surgical History:  Procedure Laterality Date   KNEE SURGERY  12/2009   LIGAMENT    Family History  Problem Relation Age of Onset   Hypertension Mother    Diabetes Sister    Hypertension Sister    Diabetes Brother    Hypertension Brother    Colon cancer Neg Hx    Colon polyps Neg Hx    Rectal cancer Neg Hx    Stomach cancer Neg Hx     Social History    Socioeconomic History   Marital status: Divorced    Spouse name: Not on file   Number of children: Not on file   Years of education: Not on file   Highest education level: Not on file  Occupational History   Not on file  Tobacco Use   Smoking status: Never   Smokeless tobacco: Never  Vaping Use   Vaping Use: Never used  Substance and Sexual Activity   Alcohol use: No   Drug use: No   Sexual activity: Not Currently    Birth control/protection: Post-menopausal    Comment: First IC >16 y/o, <5 Partners, des neg  Other Topics Concern   Not on file  Social History Narrative   Not on file   Social Determinants of Health   Financial Resource Strain: Low Risk  (01/06/2023)   Overall Financial Resource Strain (CARDIA)    Difficulty of Paying Living Expenses: Not hard at all  Food Insecurity: No Food Insecurity (01/06/2023)   Hunger Vital Sign    Worried About Running Out of Food in the Last Year: Never true    Ran Out of Food in the Last Year: Never true  Transportation Needs: No Transportation Needs (01/06/2023)   PRAPARE - Transportation  Lack of Transportation (Medical): No    Lack of Transportation (Non-Medical): No  Physical Activity: Insufficiently Active (01/06/2023)   Exercise Vital Sign    Days of Exercise per Week: 1 day    Minutes of Exercise per Session: 30 min  Stress: No Stress Concern Present (01/06/2023)   [employer] of Occupational Health - Occupational Stress Questionnaire    Feeling of Stress : Not at all  Social Connections: Moderately Isolated (01/06/2023)   Social Connection and Isolation Panel [NHANES]    Frequency of Communication with Friends and Family: Twice a week    Frequency of Social Gatherings with Friends and Family: Twice a week    Attends Religious Services: More than 4 times per year    Active Member of [employer] or Organizations: No    Attends Banker Meetings: Never    Marital Status: Divorced  Catering manager Violence:  Not At Risk (01/06/2023)   Humiliation, Afraid, Rape, and Kick questionnaire    Fear of Current or Ex-Partner: No    Emotionally Abused: No    Physically Abused: No    Sexually Abused: No    Review of Systems  All other systems reviewed and are negative.       Objective    BP 104/67   Pulse 74   Temp 98.1 F (36.7 C) (Oral)   Resp 16   Ht 5\' 5"  (1.651 m)   Wt 160 lb (72.6 kg)   LMP 12/08/2011   SpO2 97%   BMI 26.63 kg/m   Physical Exam Vitals and nursing note reviewed.  Constitutional:      General: She is not in acute distress. Cardiovascular:     Rate and Rhythm: Normal rate and regular rhythm.  Pulmonary:     Effort: Pulmonary effort is normal.     Breath sounds: Normal breath sounds.  Neurological:     General: No focal deficit present.     Mental Status: She is alert and oriented to person, place, and time.         Assessment & Plan:   1. Hyperlipidemia, unspecified hyperlipidemia type Monitoring labs ordered - Lipid Panel    No follow-ups on file.   Tommie Raymond, MD

## 2023-06-01 ENCOUNTER — Encounter: Payer: Self-pay | Admitting: Orthopedic Surgery

## 2023-06-01 ENCOUNTER — Telehealth: Payer: Self-pay

## 2023-06-01 ENCOUNTER — Other Ambulatory Visit (INDEPENDENT_AMBULATORY_CARE_PROVIDER_SITE_OTHER): Payer: BC Managed Care – PPO

## 2023-06-01 ENCOUNTER — Ambulatory Visit (INDEPENDENT_AMBULATORY_CARE_PROVIDER_SITE_OTHER): Payer: BC Managed Care – PPO | Admitting: Surgical

## 2023-06-01 DIAGNOSIS — M25562 Pain in left knee: Secondary | ICD-10-CM

## 2023-06-01 DIAGNOSIS — G8929 Other chronic pain: Secondary | ICD-10-CM | POA: Diagnosis not present

## 2023-06-01 DIAGNOSIS — M175 Other unilateral secondary osteoarthritis of knee: Secondary | ICD-10-CM | POA: Diagnosis not present

## 2023-06-01 NOTE — Progress Notes (Signed)
Office Visit Note   Patient: Brenda Hurst           Date of Birth: 1960-12-21           MRN: [id number] Visit Date: 06/01/2023 Requested by: Georganna Skeans, MD [address] [place],  [place] [address] PCP: Georganna Skeans, MD    Subjective: Chief Complaint  Patient presents with   Left Knee - Pain    HPI: Brenda Hurst is a 62 y.o. female who presents to the office reporting left knee pain.  Patient has history of left knee osteoarthritis.  She was last seen in the November 2022.  Has history of lateral meniscal tear with meniscectomy done several years ago.  She is.  She has popping and cracking occasionally.  Knee pain will also need to get.  No groin pain or radicular pain or numbness/tingling.  He uses a compression sleeve.  He has occasional pain that wakes her up from sleep at night.  No history of diabetes.  Does not know.  Blood thinner use.  She has never had injections.              ROS: All systems reviewed are negative as they relate to the chief complaint within the history of present illness.  Patient denies fevers or chills.  Assessment & Plan: Visit Diagnoses:  1. Other secondary osteoarthritis of left knee   2. Chronic pain of left knee     Plan: Patient is a 62 year old female who presents for evaluation of left knee pain.  Has history of left knee osteoarthritis primarily confined to the lateral compartment with severe degenerative changes noted on today's radiographs.  After discussion of options, she would like to try left knee gel injection.  Wants to hold off on cortisone injections as a prior cortisone injection into her elbow did not gave her any relief but she has never had a cortisone injection into her knee.  Plan to preapproved for left knee gel injection and follow-up in about 3 weeks for gel injection administration by Dr. August Saucer  Follow-Up Instructions: Return in about 4 weeks (around 06/29/2023), or for gel inj.   Orders:  Orders Placed This  Encounter  Procedures   XR KNEE 3 VIEW LEFT   No orders of the defined types were placed in this encounter.     Procedures: No procedures performed   Clinical Data: No additional findings.  Objective: Vital Signs: LMP 12/08/2011   Physical Exam:  Constitutional: Patient appears well-developed HEENT:  Head: Normocephalic Eyes:EOM are normal Neck: Normal range of motion Cardiovascular: Normal rate Pulmonary/chest: Effort normal Neurologic: Patient is alert Skin: Skin is warm Psychiatric: Patient has normal mood and affect  Ortho Exam: Ortho exam demonstrates left knee with positive effusion.  No cellulitis or skin changes noted.  She has range of motion from about 3 degrees extension to 115 degrees of knee flexion.  No calf tenderness.  Negative Homans' sign.  Able to perform straight leg raise.  No pain with hip range of motion.  Stable to anterior posterior drawer sign.  Tender mildly over the lateral joint line.  No tenderness over the medial joint line.    Specialty Comments:  No specialty comments available.  Imaging: No results found.   PMFS History: Patient Active Problem List   Diagnosis Date Noted   Contusion of left elbow 04/13/2018   Pain in joint of left elbow 01/15/2018   Contusion of wrist 01/05/2018   Cervical dysplasia 06/26/2011  Past Medical History:  Diagnosis Date   GERD (gastroesophageal reflux disease)    HSV-1 infection     Family History  Problem Relation Age of Onset   Hypertension Mother    Diabetes Sister    Hypertension Sister    Diabetes Brother    Hypertension Brother    Colon cancer Neg Hx    Colon polyps Neg Hx    Rectal cancer Neg Hx    Stomach cancer Neg Hx     Past Surgical History:  Procedure Laterality Date   KNEE SURGERY  12/2009   LIGAMENT   Social History   Occupational History   Not on file  Tobacco Use   Smoking status: Never   Smokeless tobacco: Never  Vaping Use   Vaping status: Never Used   Substance and Sexual Activity   Alcohol use: No   Drug use: No   Sexual activity: Not Currently    Birth control/protection: Post-menopausal    Comment: First IC >16 y/o, <5 Partners, des neg

## 2023-06-01 NOTE — Telephone Encounter (Signed)
auth needed for left knee gel

## 2023-06-03 NOTE — Telephone Encounter (Signed)
VOB submitted for Orthovisc, left knee.  

## 2023-06-24 ENCOUNTER — Telehealth: Payer: Self-pay

## 2023-06-24 NOTE — Telephone Encounter (Signed)
Faxed completed PA form to [facility] at [phone] for Orthovisc, left knee. PA pending

## 2023-08-04 ENCOUNTER — Telehealth: Payer: Self-pay | Admitting: Surgical

## 2023-08-04 NOTE — Telephone Encounter (Signed)
Pt came in would like to know update on Gel injection for left knee please advise

## 2023-08-05 ENCOUNTER — Other Ambulatory Visit: Payer: Self-pay

## 2023-08-05 DIAGNOSIS — M175 Other unilateral secondary osteoarthritis of knee: Secondary | ICD-10-CM

## 2023-08-05 NOTE — Telephone Encounter (Signed)
Called and left a Vm for patient to CB to schedule for gel injection.  Patient will need 3 appts. With Dr. August Saucer or Franky Macho  See referrals tab

## 2023-08-17 ENCOUNTER — Ambulatory Visit: Payer: BC Managed Care – PPO | Admitting: Surgical

## 2023-08-17 ENCOUNTER — Telehealth: Payer: Self-pay | Admitting: Orthopedic Surgery

## 2023-08-17 NOTE — Telephone Encounter (Signed)
Pt was not given correct information about Gel injection did not know It was a 3 series and she had to pay the $94 copay each visit, pt was not ready for appt at time of visit and would like to RS all 3 appts at once no space on schedule for me to do it up front please advise

## 2023-08-18 NOTE — Telephone Encounter (Signed)
Talked with patient and appointments have been scheduled for gel injection. ? ?

## 2023-08-25 ENCOUNTER — Telehealth: Payer: Self-pay | Admitting: Surgical

## 2023-08-25 NOTE — Telephone Encounter (Signed)
Patient called and said she has a few questions about the knee injection. [phone]

## 2023-08-26 ENCOUNTER — Telehealth: Payer: Self-pay | Admitting: Orthopedic Surgery

## 2023-08-26 NOTE — Telephone Encounter (Signed)
Patient called and wants someone to call her because she needs to understand the procedure. [phone]

## 2023-08-26 NOTE — Telephone Encounter (Signed)
Talked with patient and all appts.have been R/S for gel injection.

## 2023-08-26 NOTE — Telephone Encounter (Signed)
Double message

## 2023-08-26 NOTE — Telephone Encounter (Signed)
I called and answered all questions. Can you please call her to help her r/s the gel injections with luke please

## 2023-08-27 ENCOUNTER — Ambulatory Visit: Payer: BC Managed Care – PPO | Admitting: Surgical

## 2023-09-03 ENCOUNTER — Ambulatory Visit: Payer: BC Managed Care – PPO | Admitting: Physician Assistant

## 2023-09-03 ENCOUNTER — Encounter: Payer: Self-pay | Admitting: Physician Assistant

## 2023-09-03 DIAGNOSIS — M1712 Unilateral primary osteoarthritis, left knee: Secondary | ICD-10-CM

## 2023-09-03 MED ORDER — HYALURONAN 30 MG/2ML IX SOSY
30.0000 mg | PREFILLED_SYRINGE | INTRA_ARTICULAR | Status: AC | PRN
  Administered 2023-09-03: 30 mg via INTRA_ARTICULAR

## 2023-09-03 NOTE — Progress Notes (Signed)
Office Visit Note   Patient: Brenda Hurst           Date of Birth: [birth year]           MRN: [id number] Visit Date: 09/03/2023              Requested by: Georganna Skeans, MD [address] [place],  [place] [address] PCP: Georganna Skeans, MD  Chief Complaint  Patient presents with  . Left Knee - Injections    LT Knee Orthovisc #1      HPI: Patient is a pleasant 62 year old woman who is a patient of Dr. Diamantina Providence.  She comes in today for her first Orthovisc injection into her left knee  Assessment & Plan: Visit Diagnoses: Osteoarthritis left knee  Plan: Reviewed the risks and benefits of the medication.  Went forward with injection today we will follow-up in a week  Follow-Up Instructions: 1 week  Ortho Exam  Patient is alert, oriented, no adenopathy, well-dressed, normal affect, normal respiratory effort. Left knee no effusion no warmth compartments are soft and nontender obvious valgus alignment  Imaging: No results found. No images are attached to the encounter.  Labs: Lab Results  Component Value Date   HGBA1C 6.0 (H) 10/14/2022   HGBA1C 6.0 (H) 10/15/2021   LABORGA Multiple bacterial morphotypes present, none 07/11/2015   LABORGA predominant. Suggest appropriate recollection if 07/11/2015   LABORGA clinically indicated. 07/11/2015     Lab Results  Component Value Date   ALBUMIN 4.3 07/11/2016   ALBUMIN 4.4 07/11/2015    No results found for: "MG" Lab Results  Component Value Date   VD25OH 40 07/11/2016   VD25OH 27 (L) 07/11/2015    No results found for: "PREALBUMIN"    Latest Ref Rng & Units 10/14/2022    9:02 AM 10/15/2021    8:23 AM 09/27/2020    3:17 PM  CBC EXTENDED  WBC 3.8 - 10.8 Thousand/uL 4.2  3.1  4.9   RBC 3.80 - 5.10 Million/uL 4.67  4.64  4.63   Hemoglobin 11.7 - 15.5 g/dL 57.8  46.9  62.9   HCT 35.0 - 45.0 % 40.2  40.1  39.9   Platelets 140 - 400 Thousand/uL 302  234  245   NEUT# 1,500 - 7,800 cells/uL 2,083  1,497   2,602   Lymph# 850 - 3,900 cells/uL 1,604  902  1,622      There is no height or weight on file to calculate BMI.  Orders:  No orders of the defined types were placed in this encounter.  No orders of the defined types were placed in this encounter.    Procedures: Large Joint Inj on 09/03/2023 9:29 AM Indications: pain and diagnostic evaluation Details: 1.5 in anteromedial approach  Arthrogram: No  Medications: 30 mg Hyaluronan 30 MG/2ML Outcome: tolerated well, no immediate complications Procedure, treatment alternatives, risks and benefits explained, specific risks discussed. Consent was given by the patient.    Clinical Data: No additional findings.  ROS:  All other systems negative, except as noted in the HPI. Review of Systems  Objective: Vital Signs: LMP 12/08/2011   Specialty Comments:  No specialty comments available.  PMFS History: Patient Active Problem List   Diagnosis Date Noted  . Contusion of left elbow 04/13/2018  . Pain in joint of left elbow 01/15/2018  . Contusion of wrist 01/05/2018  . Cervical dysplasia 06/26/2011   Past Medical History:  Diagnosis Date  . GERD (gastroesophageal reflux disease)   .  HSV-1 infection     Family History  Problem Relation Age of Onset  . Hypertension Mother   . Diabetes Sister   . Hypertension Sister   . Diabetes Brother   . Hypertension Brother   . Colon cancer Neg Hx   . Colon polyps Neg Hx   . Rectal cancer Neg Hx   . Stomach cancer Neg Hx     Past Surgical History:  Procedure Laterality Date  . KNEE SURGERY  12/2009   LIGAMENT   Social History   Occupational History  . Not on file  Tobacco Use  . Smoking status: Never  . Smokeless tobacco: Never  Vaping Use  . Vaping status: Never Used  Substance and Sexual Activity  . Alcohol use: No  . Drug use: No  . Sexual activity: Not Currently    Birth control/protection: Post-menopausal    Comment: First IC >23 y/o, <5 Partners, des neg

## 2023-09-10 ENCOUNTER — Ambulatory Visit: Payer: BC Managed Care – PPO | Admitting: Physician Assistant

## 2023-09-10 ENCOUNTER — Encounter: Payer: Self-pay | Admitting: Physician Assistant

## 2023-09-10 DIAGNOSIS — M1712 Unilateral primary osteoarthritis, left knee: Secondary | ICD-10-CM

## 2023-09-10 MED ORDER — HYALURONAN 30 MG/2ML IX SOSY
30.0000 mg | PREFILLED_SYRINGE | INTRA_ARTICULAR | Status: AC | PRN
  Administered 2023-09-10: 30 mg via INTRA_ARTICULAR

## 2023-09-10 NOTE — Progress Notes (Signed)
Office Visit Note   Patient: Brenda Hurst           Date of Birth: 06/19/1961           MRN: [id number] Visit Date: 09/10/2023              Requested by: Georganna Skeans, MD [address] [place],  [place] [address] PCP: Georganna Skeans, MD  No chief complaint on file.     HPI: Patient is a pleasant 62 year old woman who comes in for a second Orthovisc injection into her left knee.  Tolerated the first 1 well.  No new complaints  Assessment & Plan: Visit Diagnoses: Osteoarthritis left knee  Plan: Will go forward with injection today.  Follow-up for final injection in 1 week  Follow-Up Instructions: No follow-ups on file.   Ortho Exam  Patient is alert, oriented, no adenopathy, well-dressed, normal affect, normal respiratory effort. Left knee no erythema no effusion compartments are soft and compressible neurovascular intact  Imaging: No results found. No images are attached to the encounter.  Labs: Lab Results  Component Value Date   HGBA1C 6.0 (H) 10/14/2022   HGBA1C 6.0 (H) 10/15/2021   LABORGA Multiple bacterial morphotypes present, none 07/11/2015   LABORGA predominant. Suggest appropriate recollection if 07/11/2015   LABORGA clinically indicated. 07/11/2015     Lab Results  Component Value Date   ALBUMIN 4.3 07/11/2016   ALBUMIN 4.4 07/11/2015    No results found for: "MG" Lab Results  Component Value Date   VD25OH 40 07/11/2016   VD25OH 27 (L) 07/11/2015    No results found for: "PREALBUMIN"    Latest Ref Rng & Units 10/14/2022    9:02 AM 10/15/2021    8:23 AM 09/27/2020    3:17 PM  CBC EXTENDED  WBC 3.8 - 10.8 Thousand/uL 4.2  3.1  4.9   RBC 3.80 - 5.10 Million/uL 4.67  4.64  4.63   Hemoglobin 11.7 - 15.5 g/dL 51.7  61.6  07.3   HCT 35.0 - 45.0 % 40.2  40.1  39.9   Platelets 140 - 400 Thousand/uL 302  234  245   NEUT# 1,500 - 7,800 cells/uL 2,083  1,497  2,602   Lymph# 850 - 3,900 cells/uL 1,604  902  1,622      There is no  height or weight on file to calculate BMI.  Orders:  No orders of the defined types were placed in this encounter.  No orders of the defined types were placed in this encounter.    Procedures: Large Joint Inj: L knee on 09/10/2023 9:26 AM Indications: pain and diagnostic evaluation Details: 22 G 1.5 in needle, anteromedial approach  Arthrogram: No  Medications: 30 mg Hyaluronan 30 MG/2ML Outcome: tolerated well, no immediate complications Procedure, treatment alternatives, risks and benefits explained, specific risks discussed. Consent was given by the patient.    Clinical Data: No additional findings.  ROS:  All other systems negative, except as noted in the HPI. Review of Systems  Objective: Vital Signs: LMP 12/08/2011   Specialty Comments:  No specialty comments available.  PMFS History: Patient Active Problem List   Diagnosis Date Noted  . Contusion of left elbow 04/13/2018  . Pain in joint of left elbow 01/15/2018  . Contusion of wrist 01/05/2018  . Cervical dysplasia 06/26/2011   Past Medical History:  Diagnosis Date  . GERD (gastroesophageal reflux disease)   . HSV-1 infection     Family History  Problem Relation Age of  Onset  . Hypertension Mother   . Diabetes Sister   . Hypertension Sister   . Diabetes Brother   . Hypertension Brother   . Colon cancer Neg Hx   . Colon polyps Neg Hx   . Rectal cancer Neg Hx   . Stomach cancer Neg Hx     Past Surgical History:  Procedure Laterality Date  . KNEE SURGERY  12/2009   LIGAMENT   Social History   Occupational History  . Not on file  Tobacco Use  . Smoking status: Never  . Smokeless tobacco: Never  Vaping Use  . Vaping status: Never Used  Substance and Sexual Activity  . Alcohol use: No  . Drug use: No  . Sexual activity: Not Currently    Birth control/protection: Post-menopausal    Comment: First IC >67 y/o, <5 Partners, des neg

## 2023-09-17 ENCOUNTER — Encounter: Payer: Self-pay | Admitting: Physician Assistant

## 2023-09-17 ENCOUNTER — Ambulatory Visit: Payer: BC Managed Care – PPO | Admitting: Physician Assistant

## 2023-09-17 DIAGNOSIS — G8929 Other chronic pain: Secondary | ICD-10-CM

## 2023-09-17 DIAGNOSIS — M1712 Unilateral primary osteoarthritis, left knee: Secondary | ICD-10-CM

## 2023-09-17 MED ORDER — HYALURONAN 30 MG/2ML IX SOSY
30.0000 mg | PREFILLED_SYRINGE | INTRA_ARTICULAR | Status: AC | PRN
  Administered 2023-09-17: 30 mg via INTRA_ARTICULAR

## 2023-09-17 NOTE — Progress Notes (Signed)
Office Visit Note   Patient: Brenda Hurst           Date of Birth: Mar 26, 1961           MRN: [id number] Visit Date: 09/17/2023              Requested by: Georganna Skeans, MD [address] [place],  [place] [address] PCP: Georganna Skeans, MD  Chief Complaint  Patient presents with  . Left Knee - Follow-up    Orthovisc #3      HPI: Patient is a pleasant 62 year old woman who is here for her third Orthovisc injection into her left knee.  She does feel like she is getting some relief.  Denies any side effects.  Assessment & Plan: Visit Diagnoses: Osteoarthritis left knee  Plan: Third Orthovisc injection completed May follow-up with Dr. August Saucer as needed  Follow-Up Instructions: Return if symptoms worsen or fail to improve.   Ortho Exam  Patient is alert, oriented, no adenopathy, well-dressed, normal affect, normal respiratory effort. Left knee no erythema no effusion compartments are soft and compressible she is neurovascularly intact  Imaging: No results found. No images are attached to the encounter.  Labs: Lab Results  Component Value Date   HGBA1C 6.0 (H) 10/14/2022   HGBA1C 6.0 (H) 10/15/2021   LABORGA Multiple bacterial morphotypes present, none 07/11/2015   LABORGA predominant. Suggest appropriate recollection if 07/11/2015   LABORGA clinically indicated. 07/11/2015     Lab Results  Component Value Date   ALBUMIN 4.3 07/11/2016   ALBUMIN 4.4 07/11/2015    No results found for: "MG" Lab Results  Component Value Date   VD25OH 40 07/11/2016   VD25OH 27 (L) 07/11/2015    No results found for: "PREALBUMIN"    Latest Ref Rng & Units 10/14/2022    9:02 AM 10/15/2021    8:23 AM 09/27/2020    3:17 PM  CBC EXTENDED  WBC 3.8 - 10.8 Thousand/uL 4.2  3.1  4.9   RBC 3.80 - 5.10 Million/uL 4.67  4.64  4.63   Hemoglobin 11.7 - 15.5 g/dL 41.3  24.4  01.0   HCT 35.0 - 45.0 % 40.2  40.1  39.9   Platelets 140 - 400 Thousand/uL 302  234  245   NEUT#  1,500 - 7,800 cells/uL 2,083  1,497  2,602   Lymph# 850 - 3,900 cells/uL 1,604  902  1,622      There is no height or weight on file to calculate BMI.  Orders:  No orders of the defined types were placed in this encounter.  No orders of the defined types were placed in this encounter.    Procedures: Large Joint Inj: L knee on 09/17/2023 2:38 PM Indications: pain and diagnostic evaluation Details: 22 G 1.5 in needle, anteromedial approach  Arthrogram: No  Medications: 30 mg Hyaluronan 30 MG/2ML Outcome: tolerated well, no immediate complications Procedure, treatment alternatives, risks and benefits explained, specific risks discussed. Consent was given by the patient.    Clinical Data: No additional findings.  ROS:  All other systems negative, except as noted in the HPI. Review of Systems  Objective: Vital Signs: LMP 12/08/2011   Specialty Comments:  No specialty comments available.  PMFS History: Patient Active Problem List   Diagnosis Date Noted  . Contusion of left elbow 04/13/2018  . Pain in joint of left elbow 01/15/2018  . Contusion of wrist 01/05/2018  . Cervical dysplasia 06/26/2011   Past Medical History:  Diagnosis Date  .  GERD (gastroesophageal reflux disease)   . HSV-1 infection     Family History  Problem Relation Age of Onset  . Hypertension Mother   . Diabetes Sister   . Hypertension Sister   . Diabetes Brother   . Hypertension Brother   . Colon cancer Neg Hx   . Colon polyps Neg Hx   . Rectal cancer Neg Hx   . Stomach cancer Neg Hx     Past Surgical History:  Procedure Laterality Date  . KNEE SURGERY  12/2009   LIGAMENT   Social History   Occupational History  . Not on file  Tobacco Use  . Smoking status: Never  . Smokeless tobacco: Never  Vaping Use  . Vaping status: Never Used  Substance and Sexual Activity  . Alcohol use: No  . Drug use: No  . Sexual activity: Not Currently    Birth control/protection: Post-menopausal     Comment: First IC >14 y/o, <5 Partners, des neg

## 2023-10-01 ENCOUNTER — Ambulatory Visit
Admission: EM | Admit: 2023-10-01 | Discharge: 2023-10-01 | Disposition: A | Discharge status: Home / Self Care | Payer: BC Managed Care – PPO | Attending: Emergency Medicine | Admitting: Emergency Medicine

## 2023-10-01 DIAGNOSIS — N3001 Acute cystitis with hematuria: Secondary | ICD-10-CM

## 2023-10-01 LAB — POCT URINALYSIS DIP (MANUAL ENTRY)
Bilirubin, UA: NEGATIVE
Glucose, UA: NEGATIVE mg/dL
Ketones, POC UA: NEGATIVE mg/dL
Nitrite, UA: POSITIVE — AB
Protein Ur, POC: 100 mg/dL — AB
Spec Grav, UA: 1.02 (ref 1.010–1.025)
Urobilinogen, UA: 1 U/dL
pH, UA: 7.5 (ref 5.0–8.0)

## 2023-10-01 MED ORDER — SULFAMETHOXAZOLE-TRIMETHOPRIM 800-160 MG PO TABS: 1.0000 | ORAL_TABLET | Freq: Two times a day (BID) | ORAL | 0 refills | Status: AC

## 2023-10-01 NOTE — ED Provider Notes (Signed)
[facility]    CSN: [id number] Arrival date & time: 10/01/23  0807      History   Chief Complaint No chief complaint on file.   HPI Brenda Hurst is a 62 y.o. female.   Patient presents with concerns of possible UTI. She reports painful urination, frequency, and low back discomfort for the past 4 days or so. She denies fever, nausea/vomiting, or seeing blood in her urine. She denies prior known UTI. The patient has been taking AZO with minimal improvement.   The history is provided by the patient.    Past Medical History:  Diagnosis Date   GERD (gastroesophageal reflux disease)    HSV-1 infection     Patient Active Problem List   Diagnosis Date Noted   Contusion of left elbow 04/13/2018   Pain in joint of left elbow 01/15/2018   Contusion of wrist 01/05/2018   Cervical dysplasia 06/26/2011    Past Surgical History:  Procedure Laterality Date   KNEE SURGERY  12/2009   LIGAMENT    OB History     Gravida  1   Para  1   Term      Preterm      AB      Living  1      SAB      IAB      Ectopic      Multiple      Live Births               Home Medications    Prior to Admission medications   Medication Sig Start Date End Date Taking? Authorizing Provider  sulfamethoxazole-trimethoprim (BACTRIM DS) 800-160 MG tablet Take 1 tablet by mouth 2 (two) times daily for 5 days. 10/01/23 10/06/23 Yes Maranatha Grossi L, PA  B Complex-C (SUPER B COMPLEX PO) Take 1 capsule by mouth daily.    [provider]  Biotin 1 MG CAPS Take 1 capsule by mouth daily.    [provider]  calcium carbonate (OS-CAL) 600 MG TABS tablet Take 600 mg by mouth 2 (two) times daily with a meal.    [provider]  diazepam (VALIUM) 10 MG tablet SMARTSIG:1 Tablet(s) By Mouth 11/19/22   [provider]  estradiol (ESTRACE VAGINAL) 0.1 MG/GM vaginal cream Place 1 g vaginally 2 (two) times a week. Initial dose: nightly x 2 weeks, then  twice weekly 10/16/22   Wyline Beady A, NP  fish oil-omega-3 fatty acids 1000 MG capsule Take 2 g by mouth daily.    [provider]  hydrOXYzine (ATARAX) 25 MG tablet Take 1 tablet (25 mg total) by mouth 3 (three) times daily as needed. 05/23/22   Olivia Mackie, NP  ibuprofen (ADVIL) 800 MG tablet Take 1 tablet (800 mg total) by mouth every 8 (eight) hours as needed. 09/13/21   Magnant, Joycie Peek, PA-C  Multiple Vitamin (MULTIVITAMIN) tablet Take 1 tablet by mouth daily.    [provider]  Naproxen Sodium 220 MG CAPS Take 2 capsules by mouth as needed.    [provider]  Omega-3 Fatty Acids (FISH OIL PO) Take by mouth.    [provider]  Red Yeast Rice Extract (RED YEAST RICE PO) Take 1 tablet by mouth daily.    [provider]    Family History Family History  Problem Relation Age of Onset   Hypertension Mother    Diabetes Sister    Hypertension Sister    Diabetes Brother  Hypertension Brother    Colon cancer Neg Hx    Colon polyps Neg Hx    Rectal cancer Neg Hx    Stomach cancer Neg Hx     Social History Social History   Tobacco Use   Smoking status: Never   Smokeless tobacco: Never  Vaping Use   Vaping status: Never Used  Substance Use Topics   Alcohol use: No   Drug use: No     Allergies   Diclofenac sodium and Ibuprofen   Review of Systems Review of Systems  Constitutional:  Negative for fatigue and fever.  Gastrointestinal:  Negative for abdominal pain, nausea and vomiting.  Genitourinary:  Positive for dysuria and frequency. Negative for difficulty urinating, hematuria and vaginal discharge.  Musculoskeletal:  Positive for back pain.  Skin:  Negative for rash.     Physical Exam Triage Vital Signs ED Triage Vitals  Encounter Vitals Group     BP 10/01/23 0827 120/79     Systolic BP Percentile --      Diastolic BP Percentile --      Pulse Rate 10/01/23 0827 79     Resp 10/01/23 0827 16     Temp  10/01/23 0827 98.2 F (36.8 C)     Temp Source 10/01/23 0827 Oral     SpO2 10/01/23 0827 98 %     Weight 10/01/23 0830 152 lb (68.9 kg)     Height 10/01/23 0830 5\' 5"  (1.651 m)     Head Circumference --      Peak Flow --      Pain Score 10/01/23 0829 5     Pain Loc --      Pain Education --      Exclude from Growth Chart --    No data found.  Updated Vital Signs BP 120/79 (BP Location: Left Arm)   Pulse 79   Temp 98.2 F (36.8 C) (Oral)   Resp 16   Ht 5\' 5"  (1.651 m)   Wt 152 lb (68.9 kg)   LMP 12/08/2011   SpO2 98%   BMI 25.29 kg/m   Visual Acuity Right Eye Distance:   Left Eye Distance:   Bilateral Distance:    Right Eye Near:   Left Eye Near:    Bilateral Near:     Physical Exam Vitals and nursing note reviewed.  Constitutional:      General: She is not in acute distress. Cardiovascular:     Rate and Rhythm: Normal rate and regular rhythm.     Heart sounds: Normal heart sounds.  Pulmonary:     Effort: Pulmonary effort is normal.     Breath sounds: Normal breath sounds.  Abdominal:     Palpations: Abdomen is soft.     Tenderness: There is abdominal tenderness (mild suprapubic). There is no right CVA tenderness, left CVA tenderness, guarding or rebound.  Skin:    Findings: No rash.  Neurological:     Mental Status: She is alert.      UC Treatments / Results  Labs (all labs ordered are listed, but only abnormal results are displayed) Labs Reviewed  POCT URINALYSIS DIP (MANUAL ENTRY) - Abnormal; Notable for the following components:      Result Value   Color, UA orange (*)    Clarity, UA hazy (*)    Blood, UA moderate (*)    Protein Ur, POC =100 (*)    Nitrite, UA Positive (*)    Leukocytes, UA Large (3+) (*)  All other components within normal limits  URINE CULTURE    EKG   Radiology No results found.  Procedures Procedures (including critical care time)  Medications Ordered in UC Medications - No data to display  Initial  Impression / Assessment and Plan / UC Course  I have reviewed the triage vital signs and the nursing notes.  Pertinent labs & imaging results that were available during my care of the patient were reviewed by me and considered in my medical decision making (see chart for details).     Empiric tx for UTI, culture sent out. Discussed return/ER precautions.   E/M: 1 acute uncomplicated illness, 2 data (UA, Ucx), moderate risk due to prescription management  Final Clinical Impressions(s) / UC Diagnoses   Final diagnoses:  Acute cystitis with hematuria     Discharge Instructions      Take antibiotics as prescribed. You can also continue with AZO. Keep hydrated. Follow-up with PCP if persistent symptoms after completing antibiotics. Go to the ER if develop severe back pain, vomiting, or fever.     ED Prescriptions     Medication Sig Dispense Auth. Provider   sulfamethoxazole-trimethoprim (BACTRIM DS) 800-160 MG tablet Take 1 tablet by mouth 2 (two) times daily for 5 days. 10 tablet Vallery Sa, Debrah Granderson L, PA      PDMP not reviewed this encounter.   Estanislado Pandy, [place] 10/01/23 [phone]

## 2023-10-01 NOTE — Discharge Instructions (Signed)
Take antibiotics as prescribed. You can also continue with AZO. Keep hydrated. Follow-up with PCP if persistent symptoms after completing antibiotics. Go to the ER if develop severe back pain, vomiting, or fever.

## 2023-10-01 NOTE — ED Notes (Signed)
Patient presents with pain, burning with urination and low back pain. Treated with Azo without relief. Symptoms started UTI.

## 2023-10-03 LAB — URINE CULTURE: Culture: >=100000 — AB

## 2023-11-24 ENCOUNTER — Encounter: Payer: Self-pay | Admitting: Family Medicine

## 2023-11-24 ENCOUNTER — Ambulatory Visit (INDEPENDENT_AMBULATORY_CARE_PROVIDER_SITE_OTHER): Payer: 59 | Admitting: Family Medicine

## 2023-11-24 VITALS — BP 122/72 | HR 62 | Temp 98.1°F | Resp 16 | Ht 64.0 in | Wt 161.4 lb

## 2023-11-24 DIAGNOSIS — Z114 Encounter for screening for human immunodeficiency virus [HIV]: Secondary | ICD-10-CM

## 2023-11-24 DIAGNOSIS — Z13220 Encounter for screening for lipoid disorders: Secondary | ICD-10-CM | POA: Diagnosis not present

## 2023-11-24 DIAGNOSIS — Z130 Encounter for screening for diseases of the blood and blood-forming organs and certain disorders involving the immune mechanism: Secondary | ICD-10-CM | POA: Diagnosis not present

## 2023-11-24 DIAGNOSIS — Z13228 Encounter for screening for other metabolic disorders: Secondary | ICD-10-CM

## 2023-11-24 DIAGNOSIS — Z0000 Encounter for general adult medical examination without abnormal findings: Secondary | ICD-10-CM | POA: Diagnosis not present

## 2023-11-24 DIAGNOSIS — Z1329 Encounter for screening for other suspected endocrine disorder: Secondary | ICD-10-CM

## 2023-11-24 NOTE — Progress Notes (Unsigned)
-  Patient is here to have annually  complete physical examination  -Care gap address -labs taken

## 2023-11-24 NOTE — Progress Notes (Unsigned)
Established Patient Office Visit  Subjective    Patient ID: Brenda Hurst, female    DOB: 09/17/1961  Age: 63 y.o. MRN: [id number]  CC:  Chief Complaint  Patient presents with   Annual Exam    HPI Brenda Hurst presents for routine annual exam.Patient denies acute complaints or concerns.   Outpatient Encounter Medications as of 11/24/2023  Medication Sig   B Complex-C (SUPER B COMPLEX PO) Take 1 capsule by mouth daily.   Biotin 1 MG CAPS Take 1 capsule by mouth daily.   calcium carbonate (OS-CAL) 600 MG TABS tablet Take 600 mg by mouth 2 (two) times daily with a meal.   diazepam (VALIUM) 10 MG tablet SMARTSIG:1 Tablet(s) By Mouth   estradiol (ESTRACE VAGINAL) 0.1 MG/GM vaginal cream Place 1 g vaginally 2 (two) times a week. Initial dose: nightly x 2 weeks, then twice weekly   fish oil-omega-3 fatty acids 1000 MG capsule Take 2 g by mouth daily.   ibuprofen (ADVIL) 800 MG tablet Take 1 tablet (800 mg total) by mouth every 8 (eight) hours as needed.   Multiple Vitamin (MULTIVITAMIN) tablet Take 1 tablet by mouth daily.   Naproxen Sodium 220 MG CAPS Take 2 capsules by mouth as needed.   Omega-3 Fatty Acids (FISH OIL PO) Take by mouth.   Red Yeast Rice Extract (RED YEAST RICE PO) Take 1 tablet by mouth daily.   hydrOXYzine (ATARAX) 25 MG tablet Take 1 tablet (25 mg total) by mouth 3 (three) times daily as needed. (Patient not taking: Reported on 11/24/2023)   No facility-administered encounter medications on file as of 11/24/2023.    Past Medical History:  Diagnosis Date   GERD (gastroesophageal reflux disease)    HSV-1 infection     Past Surgical History:  Procedure Laterality Date   KNEE SURGERY  12/2009   LIGAMENT    Family History  Problem Relation Age of Onset   Hypertension Mother    Diabetes Sister    Hypertension Sister    Diabetes Brother    Hypertension Brother    Colon cancer Neg Hx    Colon polyps Neg Hx    Rectal cancer Neg Hx    Stomach cancer Neg Hx      Social History   Socioeconomic History   Marital status: Divorced    Spouse name: Not on file   Number of children: Not on file   Years of education: Not on file   Highest education level: Not on file  Occupational History   Not on file  Tobacco Use   Smoking status: Never   Smokeless tobacco: Never  Vaping Use   Vaping status: Never Used  Substance and Sexual Activity   Alcohol use: No   Drug use: No   Sexual activity: Not Currently    Birth control/protection: Post-menopausal    Comment: First IC >16 y/o, <5 Partners, des neg  Other Topics Concern   Not on file  Social History Narrative   Not on file   Social Drivers of Health   Financial Resource Strain: Low Risk  (01/06/2023)   Overall Financial Resource Strain (CARDIA)    Difficulty of Paying Living Expenses: Not hard at all  Food Insecurity: No Food Insecurity (01/06/2023)   Hunger Vital Sign    Worried About Running Out of Food in the Last Year: Never true    Ran Out of Food in the Last Year: Never true  Transportation Needs: No Transportation Needs (01/06/2023)   PRAPARE -  Administrator, Civil Service (Medical): No    Lack of Transportation (Non-Medical): No  Physical Activity: Insufficiently Active (01/06/2023)   Exercise Vital Sign    Days of Exercise per Week: 1 day    Minutes of Exercise per Session: 30 min  Stress: No Stress Concern Present (01/06/2023)   [employer] of Occupational Health - Occupational Stress Questionnaire    Feeling of Stress : Not at all  Social Connections: Moderately Isolated (01/06/2023)   Social Connection and Isolation Panel [NHANES]    Frequency of Communication with Friends and Family: Twice a week    Frequency of Social Gatherings with Friends and Family: Twice a week    Attends Religious Services: More than 4 times per year    Active Member of [employer] or Organizations: No    Attends Banker Meetings: Never    Marital Status: Divorced   Catering manager Violence: Not At Risk (01/06/2023)   Humiliation, Afraid, Rape, and Kick questionnaire    Fear of Current or Ex-Partner: No    Emotionally Abused: No    Physically Abused: No    Sexually Abused: No    Review of Systems  All other systems reviewed and are negative.       Objective    BP 122/72   Pulse 62   Temp 98.1 F (36.7 C) (Oral)   Resp 16   Ht 5\' 4"  (1.626 m)   Wt 161 lb 6.4 oz (73.2 kg)   LMP 12/08/2011   SpO2 98%   PF 62 L/min   BMI 27.70 kg/m   Physical Exam Vitals and nursing note reviewed.  Constitutional:      General: She is not in acute distress. HENT:     Head: Normocephalic and atraumatic.     Right Ear: Tympanic membrane, ear canal and external ear normal.     Left Ear: Tympanic membrane, ear canal and external ear normal.     Nose: Nose normal.     Mouth/Throat:     Mouth: Mucous membranes are moist.     Pharynx: Oropharynx is clear.  Eyes:     Conjunctiva/sclera: Conjunctivae normal.     Pupils: Pupils are equal, round, and reactive to light.  Neck:     Thyroid: No thyromegaly.  Cardiovascular:     Rate and Rhythm: Normal rate and regular rhythm.     Heart sounds: Normal heart sounds. No murmur heard. Pulmonary:     Effort: Pulmonary effort is normal. No respiratory distress.     Breath sounds: Normal breath sounds.  Abdominal:     General: There is no distension.     Palpations: Abdomen is soft. There is no mass.     Tenderness: There is no abdominal tenderness.  Musculoskeletal:        General: Normal range of motion.     Cervical back: Normal range of motion and neck supple.  Skin:    General: Skin is warm and dry.  Neurological:     General: No focal deficit present.     Mental Status: She is alert and oriented to person, place, and time.  Psychiatric:        Mood and Affect: Mood normal.        Behavior: Behavior normal.         Assessment & Plan:   Annual physical exam -     CMP14+EGFR  Screening  for deficiency anemia -     CBC with Differential/Platelet  Screening for lipid disorders -     Lipid panel  Screening for HIV (human immunodeficiency virus) -     HIV Antibody (routine testing w rflx)  Screening for endocrine/metabolic/immunity disorders -     Hemoglobin A1c     No follow-ups on file.   Tommie Raymond, MD

## 2023-11-25 ENCOUNTER — Encounter: Payer: Self-pay | Admitting: Family Medicine

## 2023-11-25 LAB — CMP14+EGFR
ALT: 13 [IU]/L (ref 0–32)
AST: 20 [IU]/L (ref 0–40)
Albumin: 4.7 g/dL (ref 3.9–4.9)
Alkaline Phosphatase: 84 [IU]/L (ref 44–121)
BUN/Creatinine Ratio: 18 (ref 12–28)
BUN: 12 mg/dL (ref 8–27)
Bilirubin Total: 0.2 mg/dL (ref 0.0–1.2)
CO2: 22 mmol/L (ref 20–29)
Calcium: 10.1 mg/dL (ref 8.7–10.3)
Chloride: 103 mmol/L (ref 96–106)
Creatinine, Ser: 0.68 mg/dL (ref 0.57–1.00)
Globulin, Total: 3.1 g/dL (ref 1.5–4.5)
Glucose: 89 mg/dL (ref 70–99)
Potassium: 5 mmol/L (ref 3.5–5.2)
Sodium: 140 mmol/L (ref 134–144)
Total Protein: 7.8 g/dL (ref 6.0–8.5)
eGFR: 98 mL/min/{1.73_m2} (ref >59)

## 2023-11-25 LAB — CBC WITH DIFFERENTIAL/PLATELET
Basophils Absolute: 0 10*3/uL (ref 0.0–0.2)
Basos: 1 %
EOS (ABSOLUTE): 0.3 10*3/uL (ref 0.0–0.4)
Eos: 8 %
Hematocrit: 45.5 % (ref 34.0–46.6)
Hemoglobin: 14.4 g/dL (ref 11.1–15.9)
Immature Grans (Abs): 0 10*3/uL (ref 0.0–0.1)
Immature Granulocytes: 0 %
Lymphocytes Absolute: 1.6 10*3/uL (ref 0.7–3.1)
Lymphs: 40 %
MCH: 28 pg (ref 26.6–33.0)
MCHC: 31.6 g/dL (ref 31.5–35.7)
MCV: 89 fL (ref 79–97)
Monocytes Absolute: 0.3 10*3/uL (ref 0.1–0.9)
Monocytes: 7 %
Neutrophils Absolute: 1.8 10*3/uL (ref 1.4–7.0)
Neutrophils: 44 %
Platelets: 211 10*3/uL (ref 150–450)
RBC: 5.14 x10E6/uL (ref 3.77–5.28)
RDW: 12.2 % (ref 11.7–15.4)
WBC: 4 10*3/uL (ref 3.4–10.8)

## 2023-11-25 LAB — HEMOGLOBIN A1C
Est. average glucose Bld gHb Est-mCnc: 128 mg/dL
Hgb A1c MFr Bld: 6.1 % — ABNORMAL HIGH (ref 4.8–5.6)

## 2023-11-25 LAB — LIPID PANEL
Chol/HDL Ratio: 3.1 {ratio} (ref 0.0–4.4)
Cholesterol, Total: 224 mg/dL — ABNORMAL HIGH (ref 100–199)
HDL: 73 mg/dL (ref >39)
LDL Chol Calc (NIH): 135 mg/dL — ABNORMAL HIGH (ref 0–99)
Triglycerides: 94 mg/dL (ref 0–149)
VLDL Cholesterol Cal: 16 mg/dL (ref 5–40)

## 2023-11-25 LAB — HIV ANTIBODY (ROUTINE TESTING W REFLEX): HIV Screen 4th Generation wRfx: NONREACTIVE

## 2024-02-16 ENCOUNTER — Other Ambulatory Visit: Payer: Self-pay | Admitting: Nurse Practitioner

## 2024-02-16 DIAGNOSIS — Z1231 Encounter for screening mammogram for malignant neoplasm of breast: Secondary | ICD-10-CM

## 2024-03-17 ENCOUNTER — Ambulatory Visit
Admission: RE | Admit: 2024-03-17 | Discharge: 2024-03-17 | Disposition: A | Discharge status: Home / Self Care | Source: Ambulatory Visit | Attending: Nurse Practitioner | Admitting: Nurse Practitioner

## 2024-03-17 DIAGNOSIS — Z1231 Encounter for screening mammogram for malignant neoplasm of breast: Secondary | ICD-10-CM

## 2024-04-26 ENCOUNTER — Telehealth: Payer: Self-pay | Admitting: Physician Assistant

## 2024-04-26 NOTE — Telephone Encounter (Signed)
 Pt called to submit to insurance company for [employer] series. Pt phone number is [phone].

## 2024-04-27 NOTE — Telephone Encounter (Signed)
 VOB submitted for Orthovisc, left knee

## 2024-04-27 NOTE — Telephone Encounter (Signed)
 Talked with patient and advised her that an updated visit is needed for gel injection.  Appt.scheduled

## 2024-05-05 ENCOUNTER — Telehealth: Payer: Self-pay

## 2024-05-05 ENCOUNTER — Encounter: Payer: Self-pay | Admitting: Physician Assistant

## 2024-05-05 ENCOUNTER — Ambulatory Visit: Admitting: Physician Assistant

## 2024-05-05 DIAGNOSIS — M1712 Unilateral primary osteoarthritis, left knee: Secondary | ICD-10-CM | POA: Diagnosis not present

## 2024-05-05 NOTE — Progress Notes (Addendum)
 Office Visit Note   Patient: Brenda Hurst           Date of Birth: 21-Nov-1960           MRN: [id number] Visit Date: 05/05/2024              Requested by: Tanda Bleacher, MD [address] [place],  [place] [address] PCP: Tanda Bleacher, MD  Left knee pain    HPI: Patient is a 63 year old woman with osteoarthritis of her left knee.  She had gel injections in November they seem to help a lot she is not interested in having gel injections once again no new injuries.  Assessment & Plan: Visit Diagnoses:  1. Unilateral primary osteoarthritis, left knee     Plan: Osteoarthritis left knee.  She has significant valgus malalignment.  With bone-on-bone and sclerotic changes of the lateral compartment of the left knee.  She has been significantly helped with gel injections.  She also is requesting a handicap packer which I will give her the application today. This patient is diagnosed with osteoarthritis of the knee(s).    Radiographs show evidence of joint space narrowing, osteophytes, subchondral sclerosis and/or subchondral cysts.  This patient has knee pain which interferes with functional and activities of daily living.    This patient has experienced inadequate response, adverse effects and/or intolerance with conservative treatments such as acetaminophen, NSAIDS, topical creams, physical therapy or regular exercise, knee bracing and/or weight loss.   This patient has experienced inadequate response or has a contraindication to intra articular steroid injections for at least 3 months.   This patient is not scheduled to have a total knee replacement within 6 months of starting treatment with viscosupplementation.   Follow-Up Instructions: No follow-ups on file.   Ortho Exam  Patient is alert, oriented, no adenopathy, well-dressed, normal affect, normal respiratory effort. Left knee she has clinical valgus alignment.  She has crepitus with range of motion no effusion no  erythema she is neurovascular intact    Imaging: No results found. No images are attached to the encounter.  Labs: Lab Results  Component Value Date   HGBA1C 6.1 (H) 11/24/2023   HGBA1C 6.0 (H) 10/14/2022   HGBA1C 6.0 (H) 10/15/2021   REPTSTATUS 10/03/2023 FINAL 10/01/2023   CULT >=100,000 COLONIES/mL ESCHERICHIA COLI (A) 10/01/2023   LABORGA ESCHERICHIA COLI (A) 10/01/2023     Lab Results  Component Value Date   ALBUMIN 4.7 11/24/2023   ALBUMIN 4.3 07/11/2016   ALBUMIN 4.4 07/11/2015    No results found for: MG Lab Results  Component Value Date   VD25OH 40 07/11/2016   VD25OH 27 (L) 07/11/2015    No results found for: PREALBUMIN    Latest Ref Rng & Units 11/24/2023   10:35 AM 10/14/2022    9:02 AM 10/15/2021    8:23 AM  CBC EXTENDED  WBC 3.4 - 10.8 x10E3/uL 4.0  4.2  3.1   RBC 3.77 - 5.28 x10E6/uL 5.14  4.67  4.64   Hemoglobin 11.1 - 15.9 g/dL 85.5  86.7  86.9   HCT 34.0 - 46.6 % 45.5  40.2  40.1   Platelets 150 - 450 x10E3/uL 211  302  234   NEUT# 1.4 - 7.0 x10E3/uL 1.8  2,083  1,497   Lymph# 0.7 - 3.1 x10E3/uL 1.6  1,604  902      There is no height or weight on file to calculate BMI.  Orders:  No orders of the defined types  were placed in this encounter.  No orders of the defined types were placed in this encounter.    Procedures: No procedures performed  Clinical Data: No additional findings.  ROS:  All other systems negative, except as noted in the HPI. Review of Systems  Objective: Vital Signs: LMP 12/08/2011   Specialty Comments:  No specialty comments available.  PMFS History: Patient Active Problem List   Diagnosis Date Noted   Unilateral primary osteoarthritis, left knee 05/05/2024   Contusion of left elbow 04/13/2018   Pain in joint of left elbow 01/15/2018   Contusion of wrist 01/05/2018   Cervical dysplasia 06/26/2011   Past Medical History:  Diagnosis Date   GERD (gastroesophageal reflux disease)    HSV-1  infection     Family History  Problem Relation Age of Onset   Hypertension Mother    Diabetes Sister    Hypertension Sister    Diabetes Brother    Hypertension Brother    Colon cancer Neg Hx    Colon polyps Neg Hx    Rectal cancer Neg Hx    Stomach cancer Neg Hx    BRCA 1/2 Neg Hx    Breast cancer Neg Hx     Past Surgical History:  Procedure Laterality Date   KNEE SURGERY  12/2009   LIGAMENT   Social History   Occupational History   Not on file  Tobacco Use   Smoking status: Never   Smokeless tobacco: Never  Vaping Use   Vaping status: Never Used  Substance and Sexual Activity   Alcohol use: No   Drug use: No   Sexual activity: Not Currently    Birth control/protection: Post-menopausal    Comment: First IC >16 y/o, <5 Partners, des neg

## 2024-05-06 NOTE — Telephone Encounter (Signed)
 Faxed completed PA form to [employer] at [phone] for Orthovisc, left knee

## 2024-05-09 ENCOUNTER — Telehealth: Payer: Self-pay

## 2024-05-09 DIAGNOSIS — M1712 Unilateral primary osteoarthritis, left knee: Secondary | ICD-10-CM

## 2024-05-09 NOTE — Telephone Encounter (Signed)
Called and left a VM for patient to CB to schedule for gel injection with Mary Anne.  See referrals tab.  

## 2024-05-17 ENCOUNTER — Encounter (HOSPITAL_BASED_OUTPATIENT_CLINIC_OR_DEPARTMENT_OTHER): Payer: Self-pay | Admitting: Physician Assistant

## 2024-05-17 ENCOUNTER — Ambulatory Visit (HOSPITAL_BASED_OUTPATIENT_CLINIC_OR_DEPARTMENT_OTHER): Admitting: Physician Assistant

## 2024-05-17 DIAGNOSIS — G8929 Other chronic pain: Secondary | ICD-10-CM | POA: Diagnosis not present

## 2024-05-17 DIAGNOSIS — M25562 Pain in left knee: Secondary | ICD-10-CM | POA: Diagnosis not present

## 2024-05-17 MED ORDER — HYALURONAN 30 MG/2ML IX SOSY
30.0000 mg | PREFILLED_SYRINGE | INTRA_ARTICULAR | Status: AC
  Administered 2024-05-17: 30 mg via INTRA_ARTICULAR

## 2024-05-17 MED ORDER — HYALURONAN 30 MG/2ML IX SOSY
30.0000 mg | PREFILLED_SYRINGE | INTRA_ARTICULAR | Status: AC | PRN
  Administered 2024-05-17: 30 mg via INTRA_ARTICULAR

## 2024-05-17 NOTE — Progress Notes (Signed)
 Office Visit Note   Patient: Brenda Hurst           Date of Birth: [birth year]           MRN: [id number] Visit Date: 05/17/2024              Requested by: Tanda Bleacher, MD [address] [place],  [place] [address] PCP: Tanda Bleacher, MD  No chief complaint on file.     HPI: Patient comes in today for her first Orthovisc injection into the left knee has had these in the past without difficulty  Assessment & Plan: Visit Diagnoses:  1. Chronic pain of left knee     Plan: Injected without difficulty follow-up in 1 week  Follow-Up Instructions: Return in about 1 week (around 05/24/2024).   Ortho Exam  Patient is alert, oriented, no adenopathy, well-dressed, normal affect, normal respiratory effort. Left knee no effusion no erythema she has valgus valgus malalignment neurovascular intact    Imaging: No results found. No images are attached to the encounter.  Labs: Lab Results  Component Value Date   HGBA1C 6.1 (H) 11/24/2023   HGBA1C 6.0 (H) 10/14/2022   HGBA1C 6.0 (H) 10/15/2021   REPTSTATUS 10/03/2023 FINAL 10/01/2023   CULT >=100,000 COLONIES/mL ESCHERICHIA COLI (A) 10/01/2023   LABORGA ESCHERICHIA COLI (A) 10/01/2023     Lab Results  Component Value Date   ALBUMIN 4.7 11/24/2023   ALBUMIN 4.3 07/11/2016   ALBUMIN 4.4 07/11/2015    No results found for: MG Lab Results  Component Value Date   VD25OH 40 07/11/2016   VD25OH 27 (L) 07/11/2015    No results found for: PREALBUMIN    Latest Ref Rng & Units 11/24/2023   10:35 AM 10/14/2022    9:02 AM 10/15/2021    8:23 AM  CBC EXTENDED  WBC 3.4 - 10.8 x10E3/uL 4.0  4.2  3.1   RBC 3.77 - 5.28 x10E6/uL 5.14  4.67  4.64   Hemoglobin 11.1 - 15.9 g/dL 85.5  86.7  86.9   HCT 34.0 - 46.6 % 45.5  40.2  40.1   Platelets 150 - 450 x10E3/uL 211  302  234   NEUT# 1.4 - 7.0 x10E3/uL 1.8  2,083  1,497   Lymph# 0.7 - 3.1 x10E3/uL 1.6  1,604  902      There is no height or weight on file to  calculate BMI.  Orders:  No orders of the defined types were placed in this encounter.  Meds ordered this encounter  Medications  . Hyaluronan (ORTHOVISC) intra-articular injection 30 mg     Procedures: Large Joint Inj: L knee on 05/17/2024 9:16 AM Indications: pain and diagnostic evaluation Details: 22 G 1.5 in needle, anteromedial approach  Arthrogram: No  Medications: 30 mg Hyaluronan 30 MG/2ML Outcome: tolerated well, no immediate complications Procedure, treatment alternatives, risks and benefits explained, specific risks discussed. Consent was given by the patient. Immediately prior to procedure a time out was called to verify the correct patient, procedure, equipment, support staff and site/side marked as required. Patient was prepped and draped in the usual sterile fashion.     Clinical Data: No additional findings.  ROS:  All other systems negative, except as noted in the HPI. Review of Systems  Objective: Vital Signs: LMP 12/08/2011   Specialty Comments:  No specialty comments available.  PMFS History: Patient Active Problem List   Diagnosis Date Noted  . Unilateral primary osteoarthritis, left knee 05/05/2024  . Contusion of left  elbow 04/13/2018  . Pain in joint of left elbow 01/15/2018  . Contusion of wrist 01/05/2018  . Cervical dysplasia 06/26/2011   Past Medical History:  Diagnosis Date  . GERD (gastroesophageal reflux disease)   . HSV-1 infection     Family History  Problem Relation Age of Onset  . Hypertension Mother   . Diabetes Sister   . Hypertension Sister   . Diabetes Brother   . Hypertension Brother   . Colon cancer Neg Hx   . Colon polyps Neg Hx   . Rectal cancer Neg Hx   . Stomach cancer Neg Hx   . BRCA 1/2 Neg Hx   . Breast cancer Neg Hx     Past Surgical History:  Procedure Laterality Date  . KNEE SURGERY  12/2009   LIGAMENT   Social History   Occupational History  . Not on file  Tobacco Use  . Smoking status: Never   . Smokeless tobacco: Never  Vaping Use  . Vaping status: Never Used  Substance and Sexual Activity  . Alcohol use: No  . Drug use: No  . Sexual activity: Not Currently    Birth control/protection: Post-menopausal    Comment: First IC >69 y/o, <5 Partners, des neg

## 2024-05-24 ENCOUNTER — Encounter (HOSPITAL_BASED_OUTPATIENT_CLINIC_OR_DEPARTMENT_OTHER): Payer: Self-pay | Admitting: Physician Assistant

## 2024-05-24 ENCOUNTER — Ambulatory Visit (HOSPITAL_BASED_OUTPATIENT_CLINIC_OR_DEPARTMENT_OTHER): Admitting: Physician Assistant

## 2024-05-24 DIAGNOSIS — M79645 Pain in left finger(s): Secondary | ICD-10-CM

## 2024-05-24 DIAGNOSIS — M1712 Unilateral primary osteoarthritis, left knee: Secondary | ICD-10-CM | POA: Diagnosis not present

## 2024-05-24 MED ORDER — METHYLPREDNISOLONE ACETATE 40 MG/ML IJ SUSP
40.0000 mg | INTRAMUSCULAR | Status: AC | PRN
  Administered 2024-05-24: 40 mg via INTRA_ARTICULAR

## 2024-05-24 MED ORDER — HYALURONAN 30 MG/2ML IX SOSY
30.0000 mg | PREFILLED_SYRINGE | INTRA_ARTICULAR | Status: AC | PRN
  Administered 2024-05-24: 30 mg via INTRA_ARTICULAR

## 2024-05-24 NOTE — Progress Notes (Signed)
 Office Visit Note   Patient: Brenda Hurst           Date of Birth: 01/30/1961           MRN: [id number] Visit Date: 05/24/2024              Requested by: Tanda Bleacher, MD [address] [place],  [place] [address] PCP: Tanda Bleacher, MD  Chief Complaint  Patient presents with  . Left Knee - Follow-up      HPI: Patient is a pleasant 63 year old woman who comes in today for her second Orthovisc injection into her left knee no problems with the first injection  Assessment & Plan: Visit Diagnoses:  1. Pain of left thumb   2. Unilateral primary osteoarthritis, left knee     Plan: Second Orthovisc injected without difficulty will follow-up in 1 week for final injection  Follow-Up Instructions: No follow-ups on file.   Ortho Exam  Patient is alert, oriented, no adenopathy, well-dressed, normal affect, normal respiratory effort. Left knee no effusion no erythema compartments are soft and compressible she is neurovascularly intact    Imaging: No results found. No images are attached to the encounter.  Labs: Lab Results  Component Value Date   HGBA1C 6.1 (H) 11/24/2023   HGBA1C 6.0 (H) 10/14/2022   HGBA1C 6.0 (H) 10/15/2021   REPTSTATUS 10/03/2023 FINAL 10/01/2023   CULT >=100,000 COLONIES/mL ESCHERICHIA COLI (A) 10/01/2023   LABORGA ESCHERICHIA COLI (A) 10/01/2023     Lab Results  Component Value Date   ALBUMIN 4.7 11/24/2023   ALBUMIN 4.3 07/11/2016   ALBUMIN 4.4 07/11/2015    No results found for: MG Lab Results  Component Value Date   VD25OH 40 07/11/2016   VD25OH 27 (L) 07/11/2015    No results found for: PREALBUMIN    Latest Ref Rng & Units 11/24/2023   10:35 AM 10/14/2022    9:02 AM 10/15/2021    8:23 AM  CBC EXTENDED  WBC 3.4 - 10.8 x10E3/uL 4.0  4.2  3.1   RBC 3.77 - 5.28 x10E6/uL 5.14  4.67  4.64   Hemoglobin 11.1 - 15.9 g/dL 85.5  86.7  86.9   HCT 34.0 - 46.6 % 45.5  40.2  40.1   Platelets 150 - 450 x10E3/uL 211  302   234   NEUT# 1.4 - 7.0 x10E3/uL 1.8  2,083  1,497   Lymph# 0.7 - 3.1 x10E3/uL 1.6  1,604  902      There is no height or weight on file to calculate BMI.  Orders:  No orders of the defined types were placed in this encounter.  No orders of the defined types were placed in this encounter.    Procedures: Large Joint Inj: L knee on 05/24/2024 9:16 AM Indications: pain and diagnostic evaluation Details: 1.5 in  Arthrogram: No  Medications: 40 mg methylPREDNISolone  acetate 40 MG/ML; 30 mg Hyaluronan 30 MG/2ML Outcome: tolerated well, no immediate complications Procedure, treatment alternatives, risks and benefits explained, specific risks discussed. Consent was given by the patient. Immediately prior to procedure a time out was called to verify the correct patient, procedure, equipment, support staff and site/side marked as required. Patient was prepped and draped in the usual sterile fashion.     Clinical Data: No additional findings.  ROS:  All other systems negative, except as noted in the HPI. Review of Systems  Objective: Vital Signs: LMP 12/08/2011   Specialty Comments:  No specialty comments available.  PMFS History: Patient  Active Problem List   Diagnosis Date Noted  . Unilateral primary osteoarthritis, left knee 05/05/2024  . Contusion of left elbow 04/13/2018  . Pain in joint of left elbow 01/15/2018  . Contusion of wrist 01/05/2018  . Cervical dysplasia 06/26/2011   Past Medical History:  Diagnosis Date  . GERD (gastroesophageal reflux disease)   . HSV-1 infection     Family History  Problem Relation Age of Onset  . Hypertension Mother   . Diabetes Sister   . Hypertension Sister   . Diabetes Brother   . Hypertension Brother   . Colon cancer Neg Hx   . Colon polyps Neg Hx   . Rectal cancer Neg Hx   . Stomach cancer Neg Hx   . BRCA 1/2 Neg Hx   . Breast cancer Neg Hx     Past Surgical History:  Procedure Laterality Date  . KNEE SURGERY  12/2009    LIGAMENT   Social History   Occupational History  . Not on file  Tobacco Use  . Smoking status: Never  . Smokeless tobacco: Never  Vaping Use  . Vaping status: Never Used  Substance and Sexual Activity  . Alcohol use: No  . Drug use: No  . Sexual activity: Not Currently    Birth control/protection: Post-menopausal    Comment: First IC >65 y/o, <5 Partners, des neg

## 2024-05-31 ENCOUNTER — Ambulatory Visit (HOSPITAL_BASED_OUTPATIENT_CLINIC_OR_DEPARTMENT_OTHER): Admitting: Physician Assistant

## 2024-05-31 ENCOUNTER — Encounter (HOSPITAL_BASED_OUTPATIENT_CLINIC_OR_DEPARTMENT_OTHER): Payer: Self-pay | Admitting: Physician Assistant

## 2024-05-31 DIAGNOSIS — M1712 Unilateral primary osteoarthritis, left knee: Secondary | ICD-10-CM | POA: Diagnosis not present

## 2024-05-31 MED ORDER — HYALURONAN 30 MG/2ML IX SOSY
30.0000 mg | PREFILLED_SYRINGE | INTRA_ARTICULAR | Status: AC | PRN
Start: 2024-05-31 — End: 2024-05-31
  Administered 2024-05-31: 30 mg via INTRA_ARTICULAR

## 2024-05-31 NOTE — Progress Notes (Signed)
 Office Visit Note   Patient: Brenda Hurst           Date of Birth: 03-26-1961           MRN: [id number] Visit Date: 05/31/2024              Requested by: Tanda Bleacher, MD [address] [place],  [place] [address] PCP: Tanda Bleacher, MD  Chief Complaint  Patient presents with  . Left Knee - Follow-up      HPI: Patient is a pleasant 63 year old woman who comes in for her third Orthovisc injection into her left knee she has had no complaints.  Assessment & Plan: Visit Diagnoses:  1. Unilateral primary osteoarthritis, left knee     Plan: Reviewed she may follow-up as needed  Follow-Up Instructions: Return if symptoms worsen or fail to improve.   Ortho Exam  Patient is alert, oriented, no adenopathy, well-dressed, normal affect, normal respiratory effort. Left knee no effusion no erythema compartments are soft and compressible she is neurovascularly intact    Imaging: No results found. No images are attached to the encounter.  Labs: Lab Results  Component Value Date   HGBA1C 6.1 (H) 11/24/2023   HGBA1C 6.0 (H) 10/14/2022   HGBA1C 6.0 (H) 10/15/2021   REPTSTATUS 10/03/2023 FINAL 10/01/2023   CULT >=100,000 COLONIES/mL ESCHERICHIA COLI (A) 10/01/2023   LABORGA ESCHERICHIA COLI (A) 10/01/2023     Lab Results  Component Value Date   ALBUMIN 4.7 11/24/2023   ALBUMIN 4.3 07/11/2016   ALBUMIN 4.4 07/11/2015    No results found for: MG Lab Results  Component Value Date   VD25OH 40 07/11/2016   VD25OH 27 (L) 07/11/2015    No results found for: PREALBUMIN    Latest Ref Rng & Units 11/24/2023   10:35 AM 10/14/2022    9:02 AM 10/15/2021    8:23 AM  CBC EXTENDED  WBC 3.4 - 10.8 x10E3/uL 4.0  4.2  3.1   RBC 3.77 - 5.28 x10E6/uL 5.14  4.67  4.64   Hemoglobin 11.1 - 15.9 g/dL 85.5  86.7  86.9   HCT 34.0 - 46.6 % 45.5  40.2  40.1   Platelets 150 - 450 x10E3/uL 211  302  234   NEUT# 1.4 - 7.0 x10E3/uL 1.8  2,083  1,497   Lymph# 0.7 - 3.1  x10E3/uL 1.6  1,604  902      There is no height or weight on file to calculate BMI.  Orders:  No orders of the defined types were placed in this encounter.  No orders of the defined types were placed in this encounter.    Procedures: Large Joint Inj: L knee on 05/31/2024 9:04 AM Indications: pain and diagnostic evaluation Details: 22 G 1.5 in needle, anteromedial approach  Arthrogram: No  Medications: 30 mg Hyaluronan 30 MG/2ML Outcome: tolerated well, no immediate complications Procedure, treatment alternatives, risks and benefits explained, specific risks discussed. Consent was given by the patient. Immediately prior to procedure a time out was called to verify the correct patient, procedure, equipment, support staff and site/side marked as required. Patient was prepped and draped in the usual sterile fashion.     Clinical Data: No additional findings.  ROS:  All other systems negative, except as noted in the HPI. Review of Systems  Objective: Vital Signs: LMP 12/08/2011   Specialty Comments:  No specialty comments available.  PMFS History: Patient Active Problem List   Diagnosis Date Noted  . Unilateral primary osteoarthritis,  left knee 05/05/2024  . Contusion of left elbow 04/13/2018  . Pain in joint of left elbow 01/15/2018  . Contusion of wrist 01/05/2018  . Cervical dysplasia 06/26/2011   Past Medical History:  Diagnosis Date  . GERD (gastroesophageal reflux disease)   . HSV-1 infection     Family History  Problem Relation Age of Onset  . Hypertension Mother   . Diabetes Sister   . Hypertension Sister   . Diabetes Brother   . Hypertension Brother   . Colon cancer Neg Hx   . Colon polyps Neg Hx   . Rectal cancer Neg Hx   . Stomach cancer Neg Hx   . BRCA 1/2 Neg Hx   . Breast cancer Neg Hx     Past Surgical History:  Procedure Laterality Date  . KNEE SURGERY  12/2009   LIGAMENT   Social History   Occupational History  . Not on file   Tobacco Use  . Smoking status: Never  . Smokeless tobacco: Never  Vaping Use  . Vaping status: Never Used  Substance and Sexual Activity  . Alcohol use: No  . Drug use: No  . Sexual activity: Not Currently    Birth control/protection: Post-menopausal    Comment: First IC >45 y/o, <5 Partners, des neg

## 2024-08-29 ENCOUNTER — Encounter: Payer: Self-pay | Admitting: Radiology

## 2024-09-01 ENCOUNTER — Other Ambulatory Visit (HOSPITAL_COMMUNITY)
Admission: RE | Admit: 2024-09-01 | Discharge: 2024-09-01 | Disposition: A | Discharge status: Home / Self Care | Source: Ambulatory Visit | Attending: Nurse Practitioner | Admitting: Nurse Practitioner

## 2024-09-01 ENCOUNTER — Encounter: Payer: Self-pay | Admitting: Nurse Practitioner

## 2024-09-01 ENCOUNTER — Ambulatory Visit (INDEPENDENT_AMBULATORY_CARE_PROVIDER_SITE_OTHER): Admitting: Nurse Practitioner

## 2024-09-01 VITALS — BP 118/68 | HR 72 | Ht 64.75 in | Wt 155.0 lb

## 2024-09-01 DIAGNOSIS — Z780 Asymptomatic menopausal state: Secondary | ICD-10-CM | POA: Diagnosis not present

## 2024-09-01 DIAGNOSIS — Z01419 Encounter for gynecological examination (general) (routine) without abnormal findings: Secondary | ICD-10-CM

## 2024-09-01 DIAGNOSIS — Z1331 Encounter for screening for depression: Secondary | ICD-10-CM

## 2024-09-01 DIAGNOSIS — Z124 Encounter for screening for malignant neoplasm of cervix: Secondary | ICD-10-CM | POA: Diagnosis present

## 2024-09-01 NOTE — Progress Notes (Signed)
 Brenda Hurst 1960/12/20 [id number]   History:  63 y.o. G 1P1 presents for annual exam. Postmenopausal - no HRT, no bleeding. 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 Prediabetes, HLD managed by PCP.   Gynecologic History Patient's last menstrual period was 12/08/2011.   Contraception: post menopausal status Sexually active: No  Health maintenance Last Pap: 09/20/2019. Results were: Normal neg HPV Last mammogram: 03/17/2024. Results were: Normal Last colonoscopy: 05/09/2015. Results were: Normal, 10-year recall Last Dexa: 10/13/2018. Results were: Normal     09/01/2024    2:08 PM  Depression screen PHQ 2/9  Decreased Interest 0  Down, Depressed, Hopeless 0  PHQ - 2 Score 0     Past medical history, past surgical history, family history and social history were all reviewed and documented in the EPIC chart. Single. Works in development worker, community at [employer]. 64 yo son.    ROS:  A ROS was performed and pertinent positives and negatives are included.  Exam:  Vitals:   09/01/24 1403  BP: 118/68  Pulse: 72  SpO2: 99%  Weight: 155 lb (70.3 kg)  Height: 5' 4.75 (1.645 m)     Body mass index is 25.99 kg/m.  General appearance:  Normal Thyroid:  Symmetrical, normal in size, without palpable masses or nodularity. Respiratory  Auscultation:  Clear without wheezing or rhonchi Cardiovascular  Auscultation:  Regular rate, without rubs, murmurs or gallops  Edema/varicosities:  Not grossly evident Abdominal  Soft,nontender, without masses, guarding or rebound.  Liver/spleen:  No organomegaly noted  Hernia:  None appreciated  Skin  Inspection:  Grossly normal    Breasts: Examined lying and sitting.   Right: Without masses, retractions, discharge or axillary adenopathy.   Left: Without masses, retractions, discharge or axillary adenopathy. Pelvic: External genitalia:  no lesions              Urethra:  normal appearing urethra with no masses, tenderness or lesions               Bartholins and Skenes: normal                 Vagina: normal appearing vagina with normal color and discharge, no lesions. Atrophic changes              Cervix: no lesions Bimanual Exam:  Uterus:  no masses or tenderness              Adnexa: no mass, fullness, tenderness              Rectovaginal: Deferred              Anus:  normal, no lesions  Brenda Hurst, CMA present as chaperone.   Assessment/Plan:  63 y.o. G1P1 for annual exam.   Well female exam with routine gynecological exam - Education provided on SBEs, importance of preventative screenings, current guidelines, high calcium diet, regular exercise, and multivitamin daily. Labs with PCP.   Postmenopausal - no systemic HRT, no bleeding  Cervical cancer screening - Plan: Cytology - PAP( [facility]). 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  Pap today per guidelines.   Screening for breast cancer - Normal mammogram history.  Continue annual screenings.  Normal breast exam today.  Screening for colon cancer - 2016 colonoscopy. Will repeat at 10-year interval per GI recommendation.  Screening for osteoporosis - Normal bone density 2019. Will repeat DXA at age 1.   Return in about 1 year (around 09/01/2025) for Annual.      Brenda Hurst [facility], 2:37  PM 09/01/2024

## 2024-09-02 ENCOUNTER — Ambulatory Visit: Payer: Self-pay | Admitting: Nurse Practitioner

## 2024-09-02 LAB — CYTOLOGY - PAP
Comment: NEGATIVE
Diagnosis: NEGATIVE
High risk HPV: NEGATIVE

## 2024-10-31 ENCOUNTER — Telehealth: Payer: Self-pay | Admitting: Physician Assistant

## 2024-10-31 NOTE — Telephone Encounter (Signed)
 Pt would like to start the auth for her next gel injection

## 2024-10-31 NOTE — Telephone Encounter (Signed)
 Noted

## 2024-11-10 ENCOUNTER — Other Ambulatory Visit: Payer: Self-pay

## 2024-11-10 DIAGNOSIS — M1712 Unilateral primary osteoarthritis, left knee: Secondary | ICD-10-CM

## 2024-11-18 ENCOUNTER — Telehealth: Payer: Self-pay | Admitting: Physician Assistant

## 2024-11-18 NOTE — Telephone Encounter (Signed)
 Pt called wanting to make an apt for a Monvisc injection. I tried to make her one, but she said that a message needs to be sent back because she gets 3 in a row. Call back number is [phone]

## 2024-11-18 NOTE — Telephone Encounter (Signed)
 Yes, will call patient to schedule.

## 2024-12-06 ENCOUNTER — Ambulatory Visit: Admitting: Physician Assistant

## 2024-12-13 ENCOUNTER — Ambulatory Visit: Admitting: Physician Assistant

## 2024-12-20 ENCOUNTER — Ambulatory Visit: Admitting: Physician Assistant

## 2025-09-07 ENCOUNTER — Ambulatory Visit: Admitting: Nurse Practitioner
